# Patient Record
Sex: Female | Born: 1995 | Race: White | Hispanic: No | Marital: Single | Smoking: Never smoker
Health system: Southern US, Community
[De-identification: ages and names within clinical notes are randomized; demographics above are authoritative.]

## PROBLEM LIST (undated history)

## (undated) ENCOUNTER — Ambulatory Visit: Admission: EM | Payer: Medicaid Other | Source: Home / Self Care

## (undated) DIAGNOSIS — N730 Acute parametritis and pelvic cellulitis: Secondary | ICD-10-CM

## (undated) DIAGNOSIS — M1990 Unspecified osteoarthritis, unspecified site: Secondary | ICD-10-CM

## (undated) HISTORY — DX: Acute parametritis and pelvic cellulitis: N73.0

---
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
## 2015-05-21 DIAGNOSIS — Z6791 Unspecified blood type, Rh negative: Secondary | ICD-10-CM | POA: Insufficient documentation

## 2017-09-02 ENCOUNTER — Emergency Department: Payer: BC Managed Care – PPO

## 2017-09-02 ENCOUNTER — Ambulatory Visit
Admission: EM | Admit: 2017-09-02 | Discharge: 2017-09-02 | Disposition: A | Discharge status: Home / Self Care | Payer: BC Managed Care – PPO | Source: Home / Self Care | Attending: Emergency Medicine | Admitting: Emergency Medicine

## 2017-09-02 ENCOUNTER — Emergency Department
Admission: EM | Admit: 2017-09-02 | Discharge: 2017-09-02 | Disposition: A | Discharge status: Home / Self Care | Payer: BC Managed Care – PPO | Attending: Emergency Medicine | Admitting: Emergency Medicine

## 2017-09-02 ENCOUNTER — Other Ambulatory Visit: Payer: Self-pay

## 2017-09-02 ENCOUNTER — Encounter: Payer: Self-pay | Admitting: Emergency Medicine

## 2017-09-02 DIAGNOSIS — Z79899 Other long term (current) drug therapy: Secondary | ICD-10-CM | POA: Diagnosis not present

## 2017-09-02 DIAGNOSIS — N739 Female pelvic inflammatory disease, unspecified: Secondary | ICD-10-CM | POA: Diagnosis not present

## 2017-09-02 DIAGNOSIS — R102 Pelvic and perineal pain: Secondary | ICD-10-CM

## 2017-09-02 DIAGNOSIS — R1031 Right lower quadrant pain: Secondary | ICD-10-CM

## 2017-09-02 DIAGNOSIS — N730 Acute parametritis and pelvic cellulitis: Secondary | ICD-10-CM

## 2017-09-02 HISTORY — DX: Unspecified osteoarthritis, unspecified site: M19.90

## 2017-09-02 LAB — URINALYSIS, ROUTINE W REFLEX MICROSCOPIC
Bilirubin Urine: NEGATIVE
GLUCOSE, UA: NEGATIVE mg/dL
Hgb urine dipstick: NEGATIVE
Ketones, ur: NEGATIVE mg/dL
LEUKOCYTES UA: NEGATIVE
Nitrite: NEGATIVE
PROTEIN: NEGATIVE mg/dL
Specific Gravity, Urine: 1.009 (ref 1.005–1.030)
pH: 6 (ref 5.0–8.0)

## 2017-09-02 LAB — WET PREP, GENITAL
Sperm: NONE SEEN
Trich, Wet Prep: NONE SEEN

## 2017-09-02 LAB — CBC WITH DIFFERENTIAL/PLATELET
BASOS ABS: 0 10*3/uL (ref 0–0.1)
Basophils Relative: 0 %
EOS PCT: 1 %
Eosinophils Absolute: 0.1 10*3/uL (ref 0–0.7)
HEMATOCRIT: 38.3 % (ref 35.0–47.0)
Hemoglobin: 12.6 g/dL (ref 12.0–16.0)
LYMPHS PCT: 25 %
Lymphs Abs: 1.7 10*3/uL (ref 1.0–3.6)
MCH: 26.8 pg (ref 26.0–34.0)
MCHC: 33 g/dL (ref 32.0–36.0)
MCV: 81.2 fL (ref 80.0–100.0)
Monocytes Absolute: 0.5 10*3/uL (ref 0.2–0.9)
Monocytes Relative: 7 %
NEUTROS ABS: 4.6 10*3/uL (ref 1.4–6.5)
Neutrophils Relative %: 67 %
PLATELETS: 275 10*3/uL (ref 150–440)
RBC: 4.72 MIL/uL (ref 3.80–5.20)
RDW: 14.2 % (ref 11.5–14.5)
WBC: 6.9 10*3/uL (ref 3.6–11.0)

## 2017-09-02 LAB — URINALYSIS, COMPLETE (UACMP) WITH MICROSCOPIC
BACTERIA UA: NONE SEEN
Bilirubin Urine: NEGATIVE
Glucose, UA: NEGATIVE mg/dL
Hgb urine dipstick: NEGATIVE
KETONES UR: NEGATIVE mg/dL
Leukocytes, UA: NEGATIVE
Nitrite: NEGATIVE
PH: 7 (ref 5.0–8.0)
PROTEIN: NEGATIVE mg/dL
Specific Gravity, Urine: 1.015 (ref 1.005–1.030)
WBC, UA: NONE SEEN WBC/hpf (ref 0–5)

## 2017-09-02 LAB — CHLAMYDIA/NGC RT PCR (ARMC ONLY)
CHLAMYDIA TR: NOT DETECTED
N GONORRHOEAE: NOT DETECTED

## 2017-09-02 LAB — COMPREHENSIVE METABOLIC PANEL
ALT: 12 U/L — ABNORMAL LOW (ref 14–54)
ANION GAP: 4 — AB (ref 5–15)
AST: 16 U/L (ref 15–41)
Albumin: 4.1 g/dL (ref 3.5–5.0)
Alkaline Phosphatase: 52 U/L (ref 38–126)
BUN: 9 mg/dL (ref 6–20)
CO2: 28 mmol/L (ref 22–32)
Calcium: 8.8 mg/dL — ABNORMAL LOW (ref 8.9–10.3)
Chloride: 105 mmol/L (ref 101–111)
Creatinine, Ser: 0.48 mg/dL (ref 0.44–1.00)
Glucose, Bld: 95 mg/dL (ref 65–99)
POTASSIUM: 4.2 mmol/L (ref 3.5–5.1)
Sodium: 137 mmol/L (ref 135–145)
Total Bilirubin: 0.5 mg/dL (ref 0.3–1.2)
Total Protein: 7.3 g/dL (ref 6.5–8.1)

## 2017-09-02 LAB — PREGNANCY, URINE: Preg Test, Ur: NEGATIVE

## 2017-09-02 LAB — POCT PREGNANCY, URINE: Preg Test, Ur: NEGATIVE

## 2017-09-02 LAB — LIPASE, BLOOD: LIPASE: 24 U/L (ref 11–51)

## 2017-09-02 MED ORDER — AZITHROMYCIN 500 MG PO TABS
1000.0000 mg | ORAL_TABLET | Freq: Once | ORAL | Status: AC
  Administered 2017-09-02: 1000 mg via ORAL
  Filled 2017-09-02: qty 2

## 2017-09-02 MED ORDER — CEFTRIAXONE SODIUM 250 MG IJ SOLR
250.0000 mg | Freq: Once | INTRAMUSCULAR | Status: AC
  Administered 2017-09-02: 250 mg via INTRAMUSCULAR
  Filled 2017-09-02: qty 250

## 2017-09-02 MED ORDER — LIDOCAINE HCL (PF) 1 % IJ SOLN
5.0000 mL | Freq: Once | INTRAMUSCULAR | Status: AC
  Administered 2017-09-02: 1 mL

## 2017-09-02 MED ORDER — KETOROLAC TROMETHAMINE 30 MG/ML IJ SOLN
30.0000 mg | Freq: Once | INTRAMUSCULAR | Status: AC
  Administered 2017-09-02: 30 mg via INTRAMUSCULAR

## 2017-09-02 MED ORDER — DOXYCYCLINE HYCLATE 100 MG PO CAPS: 100.0000 mg | ORAL_CAPSULE | Freq: Two times a day (BID) | ORAL | 0 refills | Status: AC

## 2017-09-02 MED ORDER — LIDOCAINE HCL (PF) 1 % IJ SOLN
INTRAMUSCULAR | Status: AC
  Administered 2017-09-02: 1 mL
  Filled 2017-09-02: qty 5

## 2017-09-02 NOTE — Discharge Instructions (Signed)
Nothing to eat or drink until your evaluation is complete.  Let them know immediately if your abdominal pain changes, or gets worse.

## 2017-09-02 NOTE — ED Notes (Signed)
Pt states pelvic pain on R side. [facility]. No distress noted currently. Alert, oriented, ambulatory. No pain.

## 2017-09-02 NOTE — ED Triage Notes (Addendum)
Pt with right lower quad/pelvic pain off an on x past two years and hasn't had it checked out. Last two days it flared up again and is worse with standing up straight. Better with applied pressure. No n/v/d. Pt with no PCP

## 2017-09-02 NOTE — ED Triage Notes (Addendum)
Pt to ed from urgent care in [place].  Pt states went there today due to abd pain intermittent x 2 years worse over the last 2 days.  Pt states right lower pelvic pain, denies n/v/d. Pt states pressure applied to area makes pain better. LMP about 2 weeks ago.

## 2017-09-02 NOTE — ED Provider Notes (Signed)
[facility] Emergency Department Provider Note ___   First MD Initiated Contact with Patient 09/02/17 1706     (approximate)  I have reviewed the triage vital signs and the nursing notes.   HISTORY  Chief Complaint pelvic pain   HPI Brittany Horn is a 22 y.o. female presents to the emergency department with 7 out of 10 nonradiating right lower quadrant/right pelvic pain and dyspareunia intermittently times 3 years with acute worsening and persistence over the past 2 days.  Patient states that pain usually occurs following intercourse.  She states her current pain started 2 days ago following intercourse and again following intercourse again last night.  Patient denies any nausea vomiting diarrhea or constipation.  Patient denies any urinary symptoms.  Patient denies any vaginal discharge.  Patient denies any fever.  Patient denies any alleviating factors.  Regarding the patient's sexual history she states that she has been sexually active with the gentleman that is currently at the bedside for 3 years and only one other sexual partner that time which occurred 1 month ago which was unprotected.  Past Medical History:  Diagnosis Date  . Arthritis     There are no active problems to display for this patient.  Past surgical history None  Prior to Admission medications   Medication Sig Start Date End Date Taking? Authorizing Provider  doxycycline (VIBRAMYCIN) 100 MG capsule Take 1 capsule (100 mg total) by mouth 2 (two) times daily for 14 days. 09/02/17 09/16/17  Darci CurrentBrown, McArthur N, MD    Allergies No known drug allergies  Family History  Problem Relation Age of Onset  . Healthy Mother   . Healthy Father     Social History Social History   Tobacco Use  . Smoking status: Never Smoker  . Smokeless tobacco: Never Used  Substance Use Topics  . Alcohol use: Yes    Frequency: Never    Comment: social  . Drug use: No    Review of  Systems Constitutional: No fever/chills Eyes: No visual changes. ENT: No sore throat. Cardiovascular: Denies chest pain. Respiratory: Denies shortness of breath. Gastrointestinal: Positive for abdominal pain.  No nausea, no vomiting.  No diarrhea.  No constipation. Genitourinary: Negative for dysuria.  Positive for pelvic pain Musculoskeletal: Negative for neck pain.  Negative for back pain. Integumentary: Negative for rash. Neurological: Negative for headaches, focal weakness or numbness.  ____________________________________________   PHYSICAL EXAM:  VITAL SIGNS: ED Triage Vitals  Enc Vitals Group     BP 09/02/17 1328 123/70     Pulse Rate 09/02/17 1328 (!) 59     Resp 09/02/17 1328 18     Temp 09/02/17 1328 98.3 F (36.8 C)     Temp Source 09/02/17 1328 Oral     SpO2 09/02/17 1328 100 %     Weight 09/02/17 1329 83.9 kg (185 lb)     Height --      Head Circumference --      Peak Flow --      Pain Score 09/02/17 1329 0     Pain Loc --      Pain Edu? --      Excl. in GC? --     Constitutional: Alert and oriented. Well appearing and in no acute distress. Eyes: Conjunctivae are normal.  Head: Atraumatic. Mouth/Throat: Mucous membranes are moist.Oropharynx non-erythematous. Neck: No stridor.   Cardiovascular: Normal rate, regular rhythm. Good peripheral circulation. Grossly normal heart sounds. Respiratory: Normal respiratory effort.  No  retractions. Lungs CTAB. Gastrointestinal: Soft and nontender. No distention.  Genitourinary: Vaginal discharge noted.  Cervical motion tenderness.  Positive for yellowish white discharge Musculoskeletal: No lower extremity tenderness nor edema. No gross deformities of extremities. Neurologic:  Normal speech and language. No gross focal neurologic deficits are appreciated.  Skin:  Skin is warm, dry and intact. No rash noted. Psychiatric: Mood and affect are normal. Speech and behavior are  normal.  ____________________________________________   LABS (all labs ordered are listed, but only abnormal results are displayed)  Labs Reviewed  WET PREP, GENITAL - Abnormal; Notable for the following components:      Result Value   Yeast Wet Prep HPF POC PRESENT (*)    Clue Cells Wet Prep HPF POC PRESENT (*)    WBC, Wet Prep HPF POC MANY (*)    All other components within normal limits  COMPREHENSIVE METABOLIC PANEL - Abnormal; Notable for the following components:   Calcium 8.8 (*)    ALT 12 (*)    Anion gap 4 (*)    All other components within normal limits  URINALYSIS, ROUTINE W REFLEX MICROSCOPIC - Abnormal; Notable for the following components:   Color, Urine STRAW (*)    APPearance CLEAR (*)    All other components within normal limits  CHLAMYDIA/NGC RT PCR ([facility] ONLY)  LIPASE, BLOOD  CBC WITH DIFFERENTIAL/PLATELET  POC URINE PREG, ED  POCT PREGNANCY, URINE    RADIOLOGY I, Hissop N Tajai Suder, personally viewed and evaluated these images (plain radiographs) as part of my medical decision making, as well as reviewing the written report by the radiologist.   Official radiology report(s): US Pelvic Complete With Transvaginal  Result Date: 09/02/2017 CLINICAL DATA:  Pelvic pain x2 years EXAM: TRANSABDOMINAL AND TRANSVAGINAL ULTRASOUND OF PELVIS TECHNIQUE: Both transabdominal and transvaginal ultrasound examinations of the pelvis were performed. Transabdominal technique was performed for global imaging of the pelvis including uterus, ovaries, adnexal regions, and pelvic cul-de-sac. It was necessary to proceed with endovaginal exam following the transabdominal exam to visualize the uterus, endometrium and ovaries. COMPARISON:  None FINDINGS: Uterus Measurements: 9.2 x 4.6 x 5.6 cm. No fibroids or other mass visualized. Slight anteversion of the uterus. Endometrium Thickness: 13.3 mm.  No focal abnormality visualized. Right ovary Measurements: 3 x 3.1 x 3.3 cm.  Normal  appearance/no adnexal mass. Left ovary Measurements: 2.8 x 1.6 x 2.4 cm. Normal appearance/no adnexal mass. Other findings No abnormal free fluid.  The overlying bladder is unremarkable. IMPRESSION: Normal pelvic ultrasound. Electronically Signed   By: Tollie Eth M.D.   On: 09/02/2017 19:06    ____________________________________________    Procedures   ____________________________________________   INITIAL IMPRESSION / ASSESSMENT AND PLAN / ED COURSE  As part of my medical decision making, I reviewed the following data within the electronic MEDICAL RECORD 32 year old female presented with above-stated history and physical exam.  Concern for possible PID ovarian cyst versus less likely appendicitis.  Ultrasound revealed no evidence of ovarian cyst laboratory data unremarkable with the exception of cervical swab which revealed many white blood cells, clue cells & yeast.  Patient given ceftriaxone 2 and 50 mg IM as well as.  Patient will be prescribed doxycycline twice daily for 14 days.  Patient advised to follow-up with OB/GYN.     ____________________________________________  FINAL CLINICAL IMPRESSION(S) / ED DIAGNOSES  Final diagnoses:  Pelvic pain  PID (acute pelvic inflammatory disease)     MEDICATIONS GIVEN DURING THIS VISIT:  Medications  cefTRIAXone (ROCEPHIN) injection 250  mg (250 mg Intramuscular Given 09/02/17 2054)  azithromycin (ZITHROMAX) tablet 1,000 mg (1,000 mg Oral Given 09/02/17 2055)  lidocaine (PF) (XYLOCAINE) 1 % injection 5 mL (1 mL Other Given 09/02/17 2055)     ED Discharge Orders        Ordered    doxycycline (VIBRAMYCIN) 100 MG capsule  2 times daily     09/02/17 2043       Note:  This document was prepared using Dragon voice recognition software and may include unintentional dictation errors.    Darci Current, MD 09/06/17 [phone]

## 2017-09-02 NOTE — ED Notes (Signed)
Lidocaine mixed with rocephin.

## 2017-09-02 NOTE — ED Provider Notes (Signed)
HPI  SUBJECTIVE:  Brittany Horn is a 22 y.o. female who presents with sharp, nonmigratory, nonradiating right lower quadrant pain for the past 2 days.  She has had pain in this area for about 2 years but it was intermittent.  She states that it became constant 2 days ago and acutely worse last night after having intercourse.  States that intercourse has been painful for the past 2 years but was extremely painful last night.  She states it was a gradual onset pain.  She tried applying pressure with improvement in her symptoms.  Symptoms are worse with lying flat, standing up, walking.  Is not associated eating, drinking, urination, defecation.  She denies nausea, vomiting, fevers, anorexia, urinary complaints.  No vaginal bleeding, odor, discharge, rash.  No abdominal distention, back pain.  States that the car ride over here was not particularly painful.  Is in a long-term monogamous relationship with her husband, who is asymptomatic.  STDs are not a concern today.  She has a past medical history of rheumatoid arthritis and a right-sided ovarian cyst.  No history of gonorrhea, chlamydia, HIV, HSV, syphilis, Trichomonas, BV, yeast, PID, ectopic pregnancy, endometriosis, PCOS, tubo-ovarian abscess.  LMP: 2/3.  PMD: None.  Last meal at 10:45 AM.    Past Medical History:  Diagnosis Date  . Arthritis     History reviewed. No pertinent surgical history.  Family History  Problem Relation Age of Onset  . Healthy Mother   . Healthy Father     Social History   Tobacco Use  . Smoking status: Never Smoker  . Smokeless tobacco: Never Used  Substance Use Topics  . Alcohol use: Yes    Frequency: Never    Comment: social  . Drug use: No     Current Facility-Administered Medications:  .  ketorolac (TORADOL) 30 MG/ML injection 30 mg, 30 mg, Intramuscular, Once, Domenick GongMortenson, Cailin Gebel, MD No current outpatient medications on file.  No Known Allergies   ROS  As noted in HPI.   Physical  Exam  BP 109/60 (BP Location: Left Arm)   Pulse 73   Temp 97.6 F (36.4 C) (Oral)   Resp 16   Ht 5\' 8"  (1.727 m)   Wt 185 lb (83.9 kg)   LMP 08/20/2017   SpO2 98%   BMI 28.13 kg/m   Constitutional: Well developed, well nourished, appears uncomfortable Eyes:  EOMI, conjunctiva normal bilaterally HENT: Normocephalic, atraumatic,mucus membranes moist Respiratory: Normal inspiratory effort Cardiovascular: Normal rate GI: nondistended flat, soft.  Positive right lower quadrant tenderness with no guarding, rebound.  Positive Rovsing.  Active bowel sounds.  Negative tap table test. Back: No CVAT skin: No rash, skin intact Musculoskeletal: no deformities Neurologic: Alert & oriented x 3, no focal neuro deficits Psychiatric: Speech and behavior appropriate   ED Course   Medications  ketorolac (TORADOL) 30 MG/ML injection 30 mg (not administered)    Orders Placed This Encounter  Procedures  . Urinalysis, Complete w Microscopic    Standing Status:   Standing    Number of Occurrences:   1  . Pregnancy, urine    Standing Status:   Standing    Number of Occurrences:   1    Results for orders placed or performed during the hospital encounter of 09/02/17 (from the past 24 hour(s))  Urinalysis, Complete w Microscopic     Status: Abnormal   Collection Time: 09/02/17 11:35 AM  Result Value Ref Range   Color, Urine YELLOW YELLOW   APPearance  CLEAR CLEAR   Specific Gravity, Urine 1.015 1.005 - 1.030   pH 7.0 5.0 - 8.0   Glucose, UA NEGATIVE NEGATIVE mg/dL   Hgb urine dipstick NEGATIVE NEGATIVE   Bilirubin Urine NEGATIVE NEGATIVE   Ketones, ur NEGATIVE NEGATIVE mg/dL   Protein, ur NEGATIVE NEGATIVE mg/dL   Nitrite NEGATIVE NEGATIVE   Leukocytes, UA NEGATIVE NEGATIVE   Squamous Epithelial / LPF 6-30 (A) NONE SEEN   WBC, UA NONE SEEN 0 - 5 WBC/hpf   RBC / HPF 0-5 0 - 5 RBC/hpf   Bacteria, UA NONE SEEN NONE SEEN  Pregnancy, urine     Status: None   Collection Time: 09/02/17  11:36 AM  Result Value Ref Range   Preg Test, Ur NEGATIVE NEGATIVE   No results found.  ED Clinical Impression  Right lower quadrant abdominal pain   ED Assessment/Plan  She is not pregnant.  Concern for appendicitis although mesenteric adenitis vs a gynecologic cause such as ruptured ovarian cysts, PID, TOA are in the differential.  Doubt ovarian torsion.  Giving shot of Toradol 30 mg IM, transferring to [facility] ED for a comprehensive workup.  Advised patient to be n.p.o. until evaluation is complete.  Feel that the patient is stable to go by private vehicle.  Notified Gearldine Bienenstock, Consulting civil engineer.   Discussed rationale for transfer to the emergency department.  Patient and husband agree with plan.  Meds ordered this encounter  Medications  . ketorolac (TORADOL) 30 MG/ML injection 30 mg    *This clinic note was created using Scientist, clinical (histocompatibility and immunogenetics). Therefore, there may be occasional mistakes despite careful proofreading.   ?   Domenick Gong, MD 09/02/17 1241

## 2017-09-19 ENCOUNTER — Ambulatory Visit (INDEPENDENT_AMBULATORY_CARE_PROVIDER_SITE_OTHER): Payer: BC Managed Care – PPO | Admitting: Obstetrics & Gynecology

## 2017-09-19 ENCOUNTER — Encounter: Payer: Self-pay | Admitting: Obstetrics & Gynecology

## 2017-09-19 VITALS — BP 100/60 | HR 63 | Ht 68.0 in | Wt 186.0 lb

## 2017-09-19 DIAGNOSIS — N9419 Other specified dyspareunia: Secondary | ICD-10-CM | POA: Diagnosis not present

## 2017-09-19 DIAGNOSIS — R102 Pelvic and perineal pain: Secondary | ICD-10-CM | POA: Insufficient documentation

## 2017-09-19 NOTE — Patient Instructions (Signed)
Ethinyl Estradiol; Norethindrone Acetate; Ferrous fumarate tablets or capsules What is this medicine? ETHINYL ESTRADIOL; NORETHINDRONE ACETATE; FERROUS FUMARATE (ETH in il es tra DYE ole; nor eth IN drone AS e tate; FER us FUE ma rate) is an oral contraceptive. The products combine two types of female hormones, an estrogen and a progestin. They are used to prevent ovulation and pregnancy. Some products are also used to treat acne in females. This medicine may be used for other purposes; ask your health care provider or pharmacist if you have questions. COMMON BRAND NAME(S): Blisovi 24 Fe, Blisovi Fe, Estrostep Fe, Gildess 24 Fe, Gildess Fe 1.5/30, Gildess Fe 1/20, Junel Fe 1.5/30, Junel Fe 1/20, Junel Fe 24, Larin Fe, Lo Loestrin Fe, Loestrin 24 Fe, Loestrin FE 1.5/30, Loestrin FE 1/20, Lomedia 24 Fe, Microgestin 24 Fe, Microgestin Fe 1.5/30, Microgestin Fe 1/20, Tarina Fe 1/20, Taytulla, Tilia Fe, Tri-Legest Fe What should I tell my health care provider before I take this medicine? They need to know if you have any of these conditions: -abnormal vaginal bleeding -blood vessel disease -breast, cervical, endometrial, ovarian, liver, or uterine cancer -diabetes -gallbladder disease -heart disease or recent heart attack -high blood pressure -high cholesterol -history of blood clots -kidney disease -liver disease -migraine headaches -smoke tobacco -stroke -systemic lupus erythematosus (SLE) -an unusual or allergic reaction to estrogens, progestins, other medicines, foods, dyes, or preservatives -pregnant or trying to get pregnant -breast-feeding How should I use this medicine? Take this medicine by mouth. To reduce nausea, this medicine may be taken with food. Follow the directions on the prescription label. Take this medicine at the same time each day and in the order directed on the package. Do not take your medicine more often than directed. A patient package insert for the product will be  given with each prescription and refill. Read this sheet carefully each time. The sheet may change frequently. Contact your pediatrician regarding the use of this medicine in children. Special care may be needed. This medicine has been used in female children who have started having menstrual periods. Overdosage: If you think you have taken too much of this medicine contact a poison control center or emergency room at once. NOTE: This medicine is only for you. Do not share this medicine with others. What if I miss a dose? If you miss a dose, refer to the patient information sheet you received with your medicine for direction. If you miss more than one pill, this medicine may not be as effective and you may need to use another form of birth control. What may interact with this medicine? Do not take this medicine with the following medication: -dasabuvir; ombitasvir; paritaprevir; ritonavir -ombitasvir; paritaprevir; ritonavir This medicine may also interact with the following medications: -acetaminophen -antibiotics or medicines for infections, especially rifampin, rifabutin, rifapentine, and griseofulvin, and possibly penicillins or tetracyclines -aprepitant -ascorbic acid (vitamin C) -atorvastatin -barbiturate medicines, such as phenobarbital -bosentan -carbamazepine -caffeine -clofibrate -cyclosporine -dantrolene -doxercalciferol -felbamate -grapefruit juice -hydrocortisone -medicines for anxiety or sleeping problems, such as diazepam or temazepam -medicines for diabetes, including pioglitazone -mineral oil -modafinil -mycophenolate -nefazodone -oxcarbazepine -phenytoin -prednisolone -ritonavir or other medicines for HIV infection or AIDS -rosuvastatin -selegiline -soy isoflavones supplements -St. John's wort -tamoxifen or raloxifene -theophylline -thyroid hormones -topiramate -warfarin This list may not describe all possible interactions. Give your health care  provider a list of all the medicines, herbs, non-prescription drugs, or dietary supplements you use. Also tell them if you smoke, drink alcohol, or use illegal drugs. Some   items may interact with your medicine. What should I watch for while using this medicine? Visit your doctor or health care professional for regular checks on your progress. You will need a regular breast and pelvic exam and Pap smear while on this medicine. Use an additional method of contraception during the first cycle that you take these tablets. If you have any reason to think you are pregnant, stop taking this medicine right away and contact your doctor or health care professional. If you are taking this medicine for hormone related problems, it may take several cycles of use to see improvement in your condition. Smoking increases the risk of getting a blood clot or having a stroke while you are taking birth control pills, especially if you are more than 24 years old. You are strongly advised not to smoke. This medicine can make your body retain fluid, making your fingers, hands, or ankles swell. Your blood pressure can go up. Contact your doctor or health care professional if you feel you are retaining fluid. This medicine can make you more sensitive to the sun. Keep out of the sun. If you cannot avoid being in the sun, wear protective clothing and use sunscreen. Do not use sun lamps or tanning beds/booths. If you wear contact lenses and notice visual changes, or if the lenses begin to feel uncomfortable, consult your eye care specialist. In some women, tenderness, swelling, or minor bleeding of the gums may occur. Notify your dentist if this happens. Brushing and flossing your teeth regularly may help limit this. See your dentist regularly and inform your dentist of the medicines you are taking. If you are going to have elective surgery, you may need to stop taking this medicine before the surgery. Consult your health care  professional for advice. This medicine does not protect you against HIV infection (AIDS) or any other sexually transmitted diseases. What side effects may I notice from receiving this medicine? Side effects that you should report to your doctor or health care professional as soon as possible: -allergic reactions like skin rash, itching or hives, swelling of the face, lips, or tongue -breast tissue changes or discharge -changes in vaginal bleeding during your period or between your periods -changes in vision -chest pain -confusion -coughing up blood -dizziness -feeling faint or lightheaded -headaches or migraines -leg, arm or groin pain -loss of balance or coordination -severe or sudden headaches -stomach pain (severe) -sudden shortness of breath -sudden numbness or weakness of the face, arm or leg -symptoms of vaginal infection like itching, irritation or unusual discharge -tenderness in the upper abdomen -trouble speaking or understanding -vomiting -yellowing of the eyes or skin Side effects that usually do not require medical attention (report to your doctor or health care professional if they continue or are bothersome): -breakthrough bleeding and spotting that continues beyond the 3 initial cycles of pills -breast tenderness -mood changes, anxiety, depression, frustration, anger, or emotional outbursts -increased sensitivity to sun or ultraviolet light -nausea -skin rash, acne, or brown spots on the skin -weight gain (slight) This list may not describe all possible side effects. Call your doctor for medical advice about side effects. You may report side effects to FDA at 1-800-FDA-1088. Where should I keep my medicine? Keep out of the reach of children. Store at room temperature between 15 and 30 degrees C (59 and 86 degrees F). Throw away any unused medicine after the expiration date. NOTE: This sheet is a summary. It may not cover all possible information. If you   have  questions about this medicine, talk to your doctor, pharmacist, or health care provider.  2018 Elsevier/Gold Standard (2016-03-14 08:04:41)  

## 2017-09-19 NOTE — Progress Notes (Signed)
   Gynecology Pelvic Pain Evaluation   Chief Complaint:  Chief Complaint  Patient presents with  . Pelvic Inflammatory Disease    History of Present Illness:   Patient is a 22 y.o. [id number] who LMP was Patient's last menstrual period was 09/19/2017., presents today for a problem visit.  She complains of pain.   Her pain is localized to the deep pelvis area, described as intermittent, began 3 years and its severity is described as moderate. The pain radiates to the  Non-radiating. She has these associated symptoms which include none. Patient has these modifiers which include relaxation and pain medication that make it better and sex that make it worse.  Context includes: during/after intercourse.  Was seen in ER recently for worsening pain and was treated for PID, she had neg cultures and US at that time.  No prior laparoecopy.  Has prior NSVD 2 years ago.  PMHx: She  has a past medical history of Arthritis and PID (acute pelvic inflammatory disease). Also,  has no past surgical history on file., family history includes Healthy in her father and mother.,  reports that  has never smoked. she has never used smokeless tobacco. She reports that she drinks alcohol. She reports that she does not use drugs.  She has a current medication list which includes the following prescription(s): doxycycline. Also, has No Known Allergies.  Review of Systems  Constitutional: Negative for chills, fever and malaise/fatigue.  HENT: Negative for congestion, sinus pain and sore throat.   Eyes: Negative for blurred vision and pain.  Respiratory: Negative for cough and wheezing.   Cardiovascular: Negative for chest pain and leg swelling.  Gastrointestinal: Negative for abdominal pain, constipation, diarrhea, heartburn, nausea and vomiting.  Genitourinary: Negative for dysuria, frequency, hematuria and urgency.  Musculoskeletal: Negative for back pain, joint pain, myalgias and neck pain.  Skin: Negative for itching  and rash.  Neurological: Negative for dizziness, tremors and weakness.  Endo/Heme/Allergies: Does not bruise/bleed easily.  Psychiatric/Behavioral: Negative for depression. The patient is not nervous/anxious and does not have insomnia.     Objective: BP 100/60   Pulse 63   Ht 5\' 8"  (1.727 m)   Wt 186 lb (84.4 kg)   LMP 09/19/2017   BMI 28.28 kg/m  Physical Exam  Constitutional: She is oriented to person, place, and time. She appears well-developed and well-nourished. No distress.  Cardiovascular: Regular rhythm, normal heart sounds and normal pulses. Exam reveals no gallop and no friction rub.  No murmur heard. Pulmonary/Chest: Effort normal and breath sounds normal. She exhibits no tenderness.  Abdominal: Normal appearance and bowel sounds are normal. There is no tenderness. No hernia.  Musculoskeletal: Normal range of motion.  Neurological: She is alert and oriented to person, place, and time.  Skin: Skin is warm and dry.  Psychiatric: She has a normal mood and affect.  Vitals reviewed.  Female chaperone present for pelvic portion of the physical exam  Assessment: 22 y.o. [id number] with Dyspareunia; cyclical pelvic pain w periods.  1. Pelvic pain and dyspareunia May be form endometriosis, adhesions, PID Recent tx w doxycycline has helped to date, need to see long term Desires OCP anyways so trial within reason Lo LOEstrin F/u 2 mos PAP needed then as well  Annamarie MajorPaul Kamdyn Covel, MD, Merlinda FrederickFACOG [facility], [facility] 09/19/2017  9:16 AM

## 2017-09-29 ENCOUNTER — Ambulatory Visit: Payer: Self-pay | Admitting: Unknown Physician Specialty

## 2017-10-24 ENCOUNTER — Ambulatory Visit: Payer: Self-pay | Admitting: Unknown Physician Specialty

## 2017-11-21 ENCOUNTER — Encounter: Payer: BC Managed Care – PPO | Admitting: Obstetrics & Gynecology

## 2017-11-21 ENCOUNTER — Encounter

## 2018-01-14 ENCOUNTER — Emergency Department
Admission: EM | Admit: 2018-01-14 | Discharge: 2018-01-14 | Disposition: A | Discharge status: Home / Self Care | Payer: BC Managed Care – PPO | Attending: Emergency Medicine | Admitting: Emergency Medicine

## 2018-01-14 ENCOUNTER — Encounter: Payer: Self-pay | Admitting: Emergency Medicine

## 2018-01-14 ENCOUNTER — Other Ambulatory Visit: Payer: Self-pay

## 2018-01-14 DIAGNOSIS — O210 Mild hyperemesis gravidarum: Secondary | ICD-10-CM | POA: Insufficient documentation

## 2018-01-14 DIAGNOSIS — Z3A13 13 weeks gestation of pregnancy: Secondary | ICD-10-CM | POA: Insufficient documentation

## 2018-01-14 DIAGNOSIS — O219 Vomiting of pregnancy, unspecified: Secondary | ICD-10-CM | POA: Diagnosis present

## 2018-01-14 LAB — LIPASE, BLOOD: Lipase: 28 U/L (ref 11–51)

## 2018-01-14 LAB — URINALYSIS, COMPLETE (UACMP) WITH MICROSCOPIC
Bacteria, UA: NONE SEEN
Bilirubin Urine: NEGATIVE
Glucose, UA: NEGATIVE mg/dL
Hgb urine dipstick: NEGATIVE
KETONES UR: 20 mg/dL — AB
LEUKOCYTES UA: NEGATIVE
Nitrite: NEGATIVE
PROTEIN: NEGATIVE mg/dL
Specific Gravity, Urine: 1.009 (ref 1.005–1.030)
pH: 7 (ref 5.0–8.0)

## 2018-01-14 LAB — COMPREHENSIVE METABOLIC PANEL
ALT: 13 U/L (ref 0–44)
AST: 20 U/L (ref 15–41)
Albumin: 3.8 g/dL (ref 3.5–5.0)
Alkaline Phosphatase: 49 U/L (ref 38–126)
Anion gap: 7 (ref 5–15)
BUN: <5 mg/dL — ABNORMAL LOW (ref 6–20)
CALCIUM: 8.8 mg/dL — AB (ref 8.9–10.3)
CHLORIDE: 105 mmol/L (ref 98–111)
CO2: 23 mmol/L (ref 22–32)
CREATININE: 0.34 mg/dL — AB (ref 0.44–1.00)
Glucose, Bld: 84 mg/dL (ref 70–99)
Potassium: 4 mmol/L (ref 3.5–5.1)
SODIUM: 135 mmol/L (ref 135–145)
Total Bilirubin: 0.7 mg/dL (ref 0.3–1.2)
Total Protein: 7.4 g/dL (ref 6.5–8.1)

## 2018-01-14 LAB — CBC
HCT: 36.9 % (ref 35.0–47.0)
Hemoglobin: 12.9 g/dL (ref 12.0–16.0)
MCH: 28.6 pg (ref 26.0–34.0)
MCHC: 34.9 g/dL (ref 32.0–36.0)
MCV: 82 fL (ref 80.0–100.0)
PLATELETS: 210 10*3/uL (ref 150–440)
RBC: 4.5 MIL/uL (ref 3.80–5.20)
RDW: 14.4 % (ref 11.5–14.5)
WBC: 8.7 10*3/uL (ref 3.6–11.0)

## 2018-01-14 MED ORDER — METOCLOPRAMIDE HCL 10 MG PO TABS: 10.0000 mg | ORAL_TABLET | Freq: Four times a day (QID) | ORAL | 0 refills | Status: DC | PRN

## 2018-01-14 MED ORDER — SODIUM CHLORIDE 0.9 % IV BOLUS
1000.0000 mL | Freq: Once | INTRAVENOUS | Status: AC
  Administered 2018-01-14: 1000 mL via INTRAVENOUS

## 2018-01-14 NOTE — ED Notes (Signed)
Pt given warm blanket  Family at bedside

## 2018-01-14 NOTE — ED Notes (Signed)
ED Provider at bedside. 

## 2018-01-14 NOTE — ED Triage Notes (Addendum)
Pt in via POV with complaints of constant emesis and possible dehydration.  Pt denies being able to keep anything down x 2 days, reports unable to urinate since last night.  Pt is [redacted] weeks pregnant.  Vitals WDL, NAD noted at this time.

## 2018-01-14 NOTE — ED Provider Notes (Signed)
[facility] Emergency Department Provider Note  Time seen: 5:11 PM  I have reviewed the triage vital signs and the nursing notes.   HISTORY  Chief Complaint Emesis and Dehydration    HPI Brittany Horn is a 22 y.o. female with a past medical history of arthritis, G2, P1 at [redacted] weeks pregnant presents to the emergency department for nausea vomiting.  According to the patient throughout this pregnancy she has been experiencing frequent episodes of nausea vomiting.  Has tried taking vitamin B6, with little relief.  Patient states she has been vomiting for 3 days straight has not urinated since last night.  Denies any abdominal pain, denies vaginal bleeding or discharge.  Denies dysuria.   Past Medical History:  Diagnosis Date  . Arthritis   . PID (acute pelvic inflammatory disease)     Patient Active Problem List   Diagnosis Date Noted  . Dyspareunia due to medical condition in female 09/19/2017    History reviewed. No pertinent surgical history.  Prior to Admission medications   Medication Sig Start Date End Date Taking? Authorizing Provider  doxycycline (VIBRAMYCIN) 100 MG capsule Take 100 mg by mouth 2 (two) times daily.    [provider]    No Known Allergies  Family History  Problem Relation Age of Onset  . Healthy Mother   . Healthy Father     Social History Social History   Tobacco Use  . Smoking status: Never Smoker  . Smokeless tobacco: Never Used  Substance Use Topics  . Alcohol use: Yes    Frequency: Never    Comment: social  . Drug use: No    Review of Systems Constitutional: Negative for fever. Eyes: Negative for visual complaints ENT: Negative for recent illness/congestion Cardiovascular: Negative for chest pain. Respiratory: Negative for shortness of breath. Gastrointestinal: Negative for abdominal pain.  Positive for frequent nausea vomiting last episode occurred approximately 2 hours ago. Genitourinary:  Negative for urinary compaints Musculoskeletal: Negative for musculoskeletal complaints Skin: Negative for skin complaints  Neurological: Negative for headache All other ROS negative  ____________________________________________   PHYSICAL EXAM:  VITAL SIGNS: ED Triage Vitals  Enc Vitals Group     BP 01/14/18 1631 125/72     Pulse Rate 01/14/18 1631 78     Resp 01/14/18 1631 16     Temp 01/14/18 1631 98.1 F (36.7 C)     Temp Source 01/14/18 1631 Oral     SpO2 01/14/18 1631 99 %     Weight 01/14/18 1632 170 lb (77.1 kg)     Height 01/14/18 1632 5\' 8"  (1.727 m)     Head Circumference --      Peak Flow --      Pain Score 01/14/18 1632 5     Pain Loc --      Pain Edu? --      Excl. in GC? --    Constitutional: Alert and oriented. Well appearing and in no distress. Eyes: Normal exam ENT   Head: Normocephalic and atraumatic.   Mouth/Throat: Mucous membranes are moist. Cardiovascular: Normal rate, regular rhythm. No murmur Respiratory: Normal respiratory effort without tachypnea nor retractions. Breath sounds are clear  Gastrointestinal: Soft and nontender. No distention.  Musculoskeletal: Nontender with normal range of motion in all extremities.  Neurologic:  Normal speech and language. No gross focal neurologic deficits  Skin:  Skin is warm, dry and intact.  Psychiatric: Mood and affect are normal.   ____________________________________________   INITIAL IMPRESSION /  ASSESSMENT AND PLAN / ED COURSE  Pertinent labs & imaging results that were available during my care of the patient were reviewed by me and considered in my medical decision making (see chart for details).  Patient presents to the emergency department for nausea vomiting during pregnancy.  Differential would include hyperemesis gravidarum, gastroenteritis, gastritis.  We will check labs, IV hydrate and continue to closely monitor.  I discussed with the patient at length different nausea medication  such as Zofran and Reglan and Phenergan.  She would like to discuss this further with her husband before making a decision.  Discussed the possible cons as well as process of using Zofran, including the study showing an increased risk of cleft lip/palate.  Bedside ultrasound performed by myself shows good fetal movement with a normal heart rate around 152 on Doppler.  Labs are largely nonrevealing.  Anion gap is normal.  Urinalysis is normal besides a mild amount of ketones.  Patient has received 2 L of fluids, she is feeling better.  We will discharge the patient home with Reglan and have the patient follow-up with her OB/GYN. ____________________________________________   FINAL CLINICAL IMPRESSION(S) / ED DIAGNOSES  Hyperemesis gravidarum    Minna AntisPaduchowski, Dyrell Tuccillo, MD 01/14/18 1931

## 2018-01-14 NOTE — ED Notes (Signed)
Pt unable to urinate at this time.  Specimen cup provided to pt, asked to let RN know when she feels she can provide a sample.

## 2018-09-13 ENCOUNTER — Encounter: Payer: Self-pay | Admitting: Emergency Medicine

## 2018-09-13 ENCOUNTER — Ambulatory Visit
Admission: EM | Admit: 2018-09-13 | Discharge: 2018-09-13 | Disposition: A | Discharge status: Home / Self Care | Payer: BC Managed Care – PPO | Attending: Family Medicine | Admitting: Family Medicine

## 2018-09-13 DIAGNOSIS — E0789 Other specified disorders of thyroid: Secondary | ICD-10-CM | POA: Diagnosis not present

## 2018-09-13 LAB — CBC WITH DIFFERENTIAL/PLATELET
Abs Immature Granulocytes: 0.03 10*3/uL (ref 0.00–0.07)
Basophils Absolute: 0 10*3/uL (ref 0.0–0.1)
Basophils Relative: 0 %
Eosinophils Absolute: 0.1 10*3/uL (ref 0.0–0.5)
Eosinophils Relative: 2 %
HCT: 40.9 % (ref 36.0–46.0)
Hemoglobin: 13.3 g/dL (ref 12.0–15.0)
Immature Granulocytes: 0 %
Lymphocytes Relative: 26 %
Lymphs Abs: 1.8 10*3/uL (ref 0.7–4.0)
MCH: 27.5 pg (ref 26.0–34.0)
MCHC: 32.5 g/dL (ref 30.0–36.0)
MCV: 84.7 fL (ref 80.0–100.0)
Monocytes Absolute: 0.3 10*3/uL (ref 0.1–1.0)
Monocytes Relative: 5 %
Neutro Abs: 4.8 10*3/uL (ref 1.7–7.7)
Neutrophils Relative %: 67 %
Platelets: 279 10*3/uL (ref 150–400)
RBC: 4.83 MIL/uL (ref 3.87–5.11)
RDW: 13.1 % (ref 11.5–15.5)
WBC: 7.1 10*3/uL (ref 4.0–10.5)
nRBC: 0 % (ref 0.0–0.2)

## 2018-09-13 LAB — RAPID STREP SCREEN (MED CTR MEBANE ONLY): Streptococcus, Group A Screen (Direct): NEGATIVE

## 2018-09-13 NOTE — Discharge Instructions (Signed)
Use ibuprofen 600 mg 3 times daily with food as necessary for throat pain.  Schedule follow-up with ear nose and throat specialist as soon as possible.

## 2018-09-13 NOTE — ED Triage Notes (Signed)
Patient c/o sore throat that started 4 days ago. She states she had some left over antibiotics that she has taken for 3 days. Denies fever.

## 2018-09-13 NOTE — ED Provider Notes (Signed)
[facility]    CSN: [id number] Arrival date & time: 09/13/18  [id number]     History   Chief Complaint Chief Complaint  Patient presents with  . Sore Throat    HPI Brittany Horn is a 23 y.o. female.   HPI  Female presents with sore throat that started about 4 days ago.  He has been self treating with Augmentin that she has had leftover been taking it for last 3 days.  She said no fever or chills.  She feels as though something is stuck in her throat.  She has tenderness over the thyroid.  She was up all night last night trying to clear her throat.  She used cold water hot water steam to no avail.  Has had no breathing difficulties.  Last dose of the Augmentin was yesterday morning.  Her children has been have been sick recently with colds.  Vital signs today are normal.  Currently breast-feeding       Past Medical History:  Diagnosis Date  . Arthritis   . PID (acute pelvic inflammatory disease)     Patient Active Problem List   Diagnosis Date Noted  . Dyspareunia due to medical condition in female 09/19/2017    History reviewed. No pertinent surgical history.  OB History    Gravida  3   Para  2   Term  2   Preterm      AB      Living  2     SAB      TAB      Ectopic      Multiple  0   Live Births  1            Home Medications    Prior to Admission medications   Not on File    Family History Family History  Problem Relation Age of Onset  . Healthy Mother   . Healthy Father     Social History Social History   Tobacco Use  . Smoking status: Never Smoker  . Smokeless tobacco: Never Used  Substance Use Topics  . Alcohol use: Yes    Frequency: Never    Comment: social  . Drug use: No     Allergies   Patient has no known allergies.   Review of Systems Review of Systems  Constitutional: Positive for activity change. Negative for appetite change, chills, fatigue and fever.  HENT: Positive for trouble swallowing.     All other systems reviewed and are negative.    Physical Exam Triage Vital Signs ED Triage Vitals  Enc Vitals Group     BP 09/13/18 0853 108/73     Pulse Rate 09/13/18 0853 73     Resp 09/13/18 0853 18     Temp 09/13/18 0853 98.1 F (36.7 C)     Temp Source 09/13/18 0853 Oral     SpO2 09/13/18 0853 100 %     Weight 09/13/18 0851 180 lb (81.6 kg)     Height 09/13/18 0851 5\' 8"  (1.727 m)     Head Circumference --      Peak Flow --      Pain Score 09/13/18 0851 5     Pain Loc --      Pain Edu? --      Excl. in GC? --    No data found.  Updated Vital Signs BP 108/73 (BP Location: Left Arm)   Pulse 73   Temp 98.1 F (36.7 C) (  Oral)   Resp 18   Ht 5\' 8"  (1.727 m)   Wt 180 lb (81.6 kg)   LMP 09/19/2017 (LMP Unknown)   SpO2 100%   Breastfeeding Yes   BMI 27.37 kg/m   Visual Acuity Right Eye Distance:   Left Eye Distance:   Bilateral Distance:    Right Eye Near:   Left Eye Near:    Bilateral Near:     Physical Exam Vitals signs and nursing note reviewed.  Constitutional:      General: She is not in acute distress.    Appearance: She is well-developed. She is not ill-appearing, toxic-appearing or diaphoretic.  HENT:     Head: Normocephalic.     Right Ear: Tympanic membrane and ear canal normal. No drainage.     Left Ear: Tympanic membrane and ear canal normal. No drainage.     Nose: No congestion or rhinorrhea.     Mouth/Throat:     Mouth: Mucous membranes are moist. No oral lesions.     Pharynx: Oropharynx is clear. Uvula midline. No pharyngeal swelling, oropharyngeal exudate, posterior oropharyngeal erythema or uvula swelling.     Tonsils: No tonsillar exudate or tonsillar abscesses. Swelling: 0 on the right. 0 on the left.     Comments: Patient has pain with palpation of the thyroid mostly right lobe appears tender.  There is no bogginess no nodules are appreciated.  It does appear to be mildly enlarged.  Seems to be the locus of her complaints and  symptoms. Eyes:     Conjunctiva/sclera: Conjunctivae normal.     Pupils: Pupils are equal, round, and reactive to light.  Neck:     Musculoskeletal: Normal range of motion and neck supple.     Thyroid: Thyromegaly present.     Comments: Refer to oropharynx exam Pulmonary:     Effort: Pulmonary effort is normal.     Breath sounds: Normal breath sounds.  Lymphadenopathy:     Cervical: No cervical adenopathy.  Skin:    General: Skin is warm and dry.  Neurological:     General: No focal deficit present.     Mental Status: She is alert and oriented to person, place, and time.  Psychiatric:        Mood and Affect: Mood normal.        Behavior: Behavior normal.      UC Treatments / Results  Labs (all labs ordered are listed, but only abnormal results are displayed) Labs Reviewed  RAPID STREP SCREEN ([facility] ONLY)  CULTURE, GROUP A STREP ([facility])  CBC WITH DIFFERENTIAL/PLATELET  TSH    EKG None  Radiology No results found.  Procedures Procedures (including critical care time)  Medications Ordered in UC Medications - No data to display  Initial Impression / Assessment and Plan / UC Course  I have reviewed the triage vital signs and the nursing notes.  Pertinent labs & imaging results that were available during my care of the patient were reviewed by me and considered in my medical decision making (see chart for details).   Her exam of her throat today was negative for tenderness of her the thyroid which appears to be slightly enlarged.  No nodularities were appreciated.  Recommended salt water gargles for her throat pain and also ibuprofen 600 mg 3 times daily with food.  I have given her the name of ear nose and throat specialist for further evaluation.  She should contact them today for an appointment as soon as  possible.  TSH was drawn today but results not available until later today or tomorrow.  Also recommended she follow-up with her primary care  physician.   Final Clinical Impressions(s) / UC Diagnoses   Final diagnoses:  Thyroid pain     Discharge Instructions     Use ibuprofen 600 mg 3 times daily with food as necessary for throat pain.  Schedule follow-up with ear nose and throat specialist as soon as possible.    ED Prescriptions    None     Controlled Substance Prescriptions [place] Controlled Substance Registry consulted? Not Applicable   Lutricia Feil, PA-C 09/13/18 1011

## 2018-09-14 LAB — TSH: TSH: 1.844 u[IU]/mL (ref 0.350–4.500)

## 2018-09-15 LAB — CULTURE, GROUP A STREP (THRC)

## 2018-09-17 ENCOUNTER — Telehealth (HOSPITAL_COMMUNITY): Payer: Self-pay | Admitting: Emergency Medicine

## 2018-09-17 NOTE — Telephone Encounter (Signed)
Patient contacted and made aware of all results, all questions answered.   

## 2018-09-24 ENCOUNTER — Ambulatory Visit: Payer: Self-pay | Admitting: Family Medicine

## 2019-08-02 ENCOUNTER — Other Ambulatory Visit: Payer: Self-pay

## 2019-08-02 ENCOUNTER — Encounter: Payer: Self-pay | Admitting: Emergency Medicine

## 2019-08-02 ENCOUNTER — Ambulatory Visit
Admission: EM | Admit: 2019-08-02 | Discharge: 2019-08-02 | Disposition: A | Discharge status: Home / Self Care | Payer: BC Managed Care – PPO | Attending: Family Medicine | Admitting: Family Medicine

## 2019-08-02 DIAGNOSIS — F419 Anxiety disorder, unspecified: Secondary | ICD-10-CM

## 2019-08-02 MED ORDER — SERTRALINE HCL 50 MG PO TABS: 50.0000 mg | ORAL_TABLET | Freq: Every day | ORAL | 0 refills | Status: DC

## 2019-08-02 MED ORDER — LORAZEPAM 0.5 MG PO TABS: ORAL_TABLET | ORAL | 0 refills | Status: DC

## 2019-08-02 NOTE — ED Provider Notes (Signed)
[facility]    CSN: [id number] Arrival date & time: 08/02/19  1536      History   Chief Complaint Chief Complaint  Patient presents with  . Anxiety    HPI Brittany Horn is a 24 y.o. female.   24 yo female with a c/o anxiety for several years which has now worsened over the past several months due to life/family stressors. Denies any suicidal or homicidal ideation. States she has not been on any medications. States she's otherwise healthy. States when anxious she gets sweaty, has palpitations and shaky.    Anxiety    Past Medical History:  Diagnosis Date  . Arthritis   . PID (acute pelvic inflammatory disease)     Patient Active Problem List   Diagnosis Date Noted  . Dyspareunia due to medical condition in female 09/19/2017    History reviewed. No pertinent surgical history.  OB History    Gravida  3   Para  2   Term  2   Preterm      AB      Living  2     SAB      TAB      Ectopic      Multiple  0   Live Births  1            Home Medications    Prior to Admission medications   Medication Sig Start Date End Date Taking? Authorizing Provider  levonorgestrel (MIRENA) 20 MCG/24HR IUD 1 each by Intrauterine route once.   Yes [provider]  LORazepam (ATIVAN) 0.5 MG tablet 1 tab po prn anxiety/panic attack 08/02/19   Norval Gable, MD  sertraline (ZOLOFT) 50 MG tablet Take 1 tablet (50 mg total) by mouth daily. 08/02/19   Norval Gable, MD    Family History Family History  Problem Relation Age of Onset  . Healthy Mother   . Healthy Father     Social History Social History   Tobacco Use  . Smoking status: Never Smoker  . Smokeless tobacco: Never Used  Substance Use Topics  . Alcohol use: Yes    Comment: social  . Drug use: No     Allergies   Patient has no known allergies.   Review of Systems Review of Systems   Physical Exam Triage Vital Signs ED Triage Vitals  Enc Vitals Group     BP  08/02/19 1549 122/80     Pulse Rate 08/02/19 1549 70     Resp 08/02/19 1549 14     Temp 08/02/19 1549 98.3 F (36.8 C)     Temp Source 08/02/19 1549 Oral     SpO2 08/02/19 1549 100 %     Weight 08/02/19 1546 180 lb (81.6 kg)     Height 08/02/19 1546 5\' 8"  (1.727 m)     Head Circumference --      Peak Flow --      Pain Score 08/02/19 1546 0     Pain Loc --      Pain Edu? --      Excl. in [place]? --    No data found.  Updated Vital Signs BP 122/80 (BP Location: Left Arm)   Pulse 70   Temp 98.3 F (36.8 C) (Oral)   Resp 14   Ht 5\' 8"  (1.727 m)   Wt 81.6 kg   SpO2 100%   Breastfeeding No   BMI 27.37 kg/m   Visual Acuity Right Eye Distance:  Left Eye Distance:   Bilateral Distance:    Right Eye Near:   Left Eye Near:    Bilateral Near:     Physical Exam Vitals and nursing note reviewed.  Constitutional:      General: She is not in acute distress.    Appearance: She is not toxic-appearing or diaphoretic.  Cardiovascular:     Rate and Rhythm: Normal rate.  Pulmonary:     Effort: Pulmonary effort is normal. No respiratory distress.  Neurological:     Mental Status: She is alert.  Psychiatric:        Attention and Perception: Attention normal.        Mood and Affect: Mood is anxious and depressed.        Speech: Speech normal.        Behavior: Behavior normal.        Thought Content: Thought content normal.        Judgment: Judgment normal.      UC Treatments / Results  Labs (all labs ordered are listed, but only abnormal results are displayed) Labs Reviewed - No data to display  EKG   Radiology No results found.  Procedures Procedures (including critical care time)  Medications Ordered in UC Medications - No data to display  Initial Impression / Assessment and Plan / UC Course  I have reviewed the triage vital signs and the nursing notes.  Pertinent labs & imaging results that were available during my care of the patient were reviewed by me and  considered in my medical decision making (see chart for details).      Final Clinical Impressions(s) / UC Diagnoses   Final diagnoses:  Anxiety     Discharge Instructions     Follow up with primary care provider    ED Prescriptions    Medication Sig Dispense Auth. Provider   sertraline (ZOLOFT) 50 MG tablet Take 1 tablet (50 mg total) by mouth daily. 21 tablet Malichi Palardy, MD   LORazepam (ATIVAN) 0.5 MG tablet 1 tab po prn anxiety/panic attack 5 tablet Chandon Lazcano, Pamala Hurry, MD      1. diagnosis reviewed with patient 2. rx as per orders above; reviewed possible side effects, interactions, risks and benefits  3. Follow up with PCP 4. Follow-up prn if symptoms worsen or don't improve   I have reviewed the PDMP during this encounter.   Payton Mccallum, MD 08/02/19 Paulo Fruit

## 2019-08-02 NOTE — ED Triage Notes (Signed)
Patient states that she has been struggling with some anxiety for three years and would like to see someone for this.  Patient states that she has not been on any anxiety medicines in the past.  Patient states that she been under a lot of stress and certain family situations have contributed to her anxiety.

## 2019-08-02 NOTE — Discharge Instructions (Signed)
Follow up with primary care provider 

## 2020-02-12 DIAGNOSIS — J020 Streptococcal pharyngitis: Secondary | ICD-10-CM | POA: Diagnosis not present

## 2020-02-12 DIAGNOSIS — J029 Acute pharyngitis, unspecified: Secondary | ICD-10-CM | POA: Diagnosis not present

## 2020-11-26 ENCOUNTER — Encounter: Payer: Self-pay | Admitting: Obstetrics and Gynecology

## 2020-11-26 ENCOUNTER — Other Ambulatory Visit: Payer: Self-pay

## 2020-11-26 ENCOUNTER — Ambulatory Visit: Payer: Medicaid Other

## 2020-11-26 ENCOUNTER — Other Ambulatory Visit: Payer: Self-pay | Admitting: Obstetrics and Gynecology

## 2020-11-26 DIAGNOSIS — Z8481 Family history of carrier of genetic disease: Secondary | ICD-10-CM

## 2020-11-26 NOTE — Progress Notes (Addendum)
  Pt's mother, Shelaine Frie, 12/08/68, diagnosed with BARD1 and RAD51C genetic mutations. Pt and her mom and sister came to office today to go over results and mgmt options. Pt elects to have [facility] single site testing done. Sample collected and sent off. Will f/u with results.   12/18/20 Received notification from [employer] that they are considered OON with her [facility] Medicaid plan and appeal needs to be done from our end. I spoke to [facility] with [facility] [phone], call ref # [id number]). He gave me website for appeals process. Would need to mail documentation and then could take 30 days for response. Since it is an [place] lab and not an issue with [place], [employer] would probably also need to then send appeal.  Given that genes mother tested positive for don't start any med mgmt till age 5, pt and I decided to wait till her health insurance changes and then retry to get testing covered at a later date. Also, OON lab status may change. Pt to f/u when insurance changes. OR, pt can pay $249 self pay rate.

## 2021-01-04 DIAGNOSIS — J039 Acute tonsillitis, unspecified: Secondary | ICD-10-CM | POA: Diagnosis not present

## 2021-01-04 DIAGNOSIS — J028 Acute pharyngitis due to other specified organisms: Secondary | ICD-10-CM | POA: Diagnosis not present

## 2021-01-04 DIAGNOSIS — Z8709 Personal history of other diseases of the respiratory system: Secondary | ICD-10-CM | POA: Diagnosis not present

## 2021-01-04 DIAGNOSIS — J029 Acute pharyngitis, unspecified: Secondary | ICD-10-CM | POA: Diagnosis not present

## 2021-06-23 ENCOUNTER — Ambulatory Visit
Admission: EM | Admit: 2021-06-23 | Discharge: 2021-06-23 | Disposition: A | Discharge status: Home / Self Care | Payer: Medicaid Other | Attending: Emergency Medicine | Admitting: Emergency Medicine

## 2021-06-23 ENCOUNTER — Encounter: Payer: Self-pay | Admitting: Emergency Medicine

## 2021-06-23 ENCOUNTER — Other Ambulatory Visit: Payer: Self-pay

## 2021-06-23 DIAGNOSIS — J029 Acute pharyngitis, unspecified: Secondary | ICD-10-CM | POA: Diagnosis not present

## 2021-06-23 LAB — POCT RAPID STREP A (OFFICE): Rapid Strep A Screen: NEGATIVE

## 2021-06-23 MED ORDER — AMOXICILLIN 875 MG PO TABS: 875.0000 mg | ORAL_TABLET | Freq: Two times a day (BID) | ORAL | 0 refills | Status: AC

## 2021-06-23 NOTE — ED Provider Notes (Signed)
Roderic Palau    CSN: [id number] Arrival date & time: 06/23/21  7253      History   Chief Complaint Chief Complaint  Patient presents with   Sore Throat    HPI Brittany Horn is a 25 y.o. female.  Patient presents with sore throat since yesterday.  She reports history of frequent strep infections.  She denies fever, chills, drainage, shortness of breath, or other symptoms.  No treatments attempted at home.  The history is provided by the patient.   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 No pertinent surgical history.  OB History     Gravida  3   Para  2   Term  2   Preterm      AB      Living  2      SAB      IAB      Ectopic      Multiple  0   Live Births  1            Home Medications    Prior to Admission medications   Medication Sig Start Date End Date Taking? Authorizing Provider  amoxicillin (AMOXIL) 875 MG tablet Take 1 tablet (875 mg total) by mouth 2 (two) times daily for 7 days. 06/23/21 06/30/21 Yes Sharion Balloon, NP  levonorgestrel (MIRENA) 20 MCG/24HR IUD 1 each by Intrauterine route once.    [provider]  LORazepam (ATIVAN) 0.5 MG tablet 1 tab po prn anxiety/panic attack 08/02/19   Norval Gable, MD  sertraline (ZOLOFT) 50 MG tablet Take 1 tablet (50 mg total) by mouth daily. 08/02/19   Norval Gable, MD    Family History Family History  Problem Relation Age of Onset   Other Mother 58       BARD1 gene mutation positive   Healthy Father    Uterine cancer Maternal Grandmother 12   Kidney cancer Paternal Grandmother    Lung cancer Paternal Grandmother    Breast cancer Maternal Aunt        41s   Ovarian cancer Maternal Aunt        62s   Brain cancer Paternal Aunt        60s   Breast cancer Paternal Aunt         51s   Breast cancer Paternal Aunt 67    Social History Social History   Tobacco Use   Smoking status: Never   Smokeless tobacco: Never  Vaping Use   Vaping Use: Never used  Substance Use Topics   Alcohol use: Yes    Comment: social   Drug use: No     Allergies   Patient has no known allergies.   Review of Systems Review of Systems  Constitutional:  Negative for chills and fever.  HENT:  Positive for sore throat. Negative for ear pain.   Respiratory:  Negative for cough and shortness of breath.   Cardiovascular:  Negative for chest pain and palpitations.  Gastrointestinal:  Negative for diarrhea and vomiting.  Skin:  Negative for color change and rash.  All other systems reviewed and are negative.   Physical Exam Triage Vital Signs ED Triage Vitals  Enc Vitals Group     BP  Pulse      Resp      Temp      Temp src      SpO2      Weight      Height      Head Circumference      Peak Flow      Pain Score      Pain Loc      Pain Edu?      Excl. in [place]?    No data found.  Updated Vital Signs BP 113/75 (BP Location: Left Arm)   Pulse 88   Temp 98.5 F (36.9 C) (Oral)   Resp 18   SpO2 97%   Visual Acuity Right Eye Distance:   Left Eye Distance:   Bilateral Distance:    Right Eye Near:   Left Eye Near:    Bilateral Near:     Physical Exam Vitals and nursing note reviewed.  Constitutional:      General: She is not in acute distress.    Appearance: She is well-developed. She is ill-appearing.  HENT:     Right Ear: Tympanic membrane normal.     Left Ear: Tympanic membrane normal.     Nose: Nose normal.     Mouth/Throat:     Mouth: Mucous membranes are moist.     Pharynx: Posterior oropharyngeal erythema present.     Comments: Posterior pharynx beefy red. Cardiovascular:     Rate and Rhythm: Normal rate and regular rhythm.     Heart sounds: No murmur heard. Pulmonary:     Effort: Pulmonary effort is normal. No respiratory distress.      Breath sounds: Normal breath sounds.  Abdominal:     Palpations: Abdomen is soft.     Tenderness: There is no abdominal tenderness.  Musculoskeletal:     Cervical back: Neck supple.  Skin:    General: Skin is warm and dry.  Neurological:     Mental Status: She is alert.  Psychiatric:        Mood and Affect: Mood normal.        Behavior: Behavior normal.     UC Treatments / Results  Labs (all labs ordered are listed, but only abnormal results are displayed) Labs Reviewed  CULTURE, GROUP A STREP [facility])  POCT RAPID STREP A (OFFICE)    EKG   Radiology No results found.  Procedures Procedures (including critical care time)  Medications Ordered in UC Medications - No data to display  Initial Impression / Assessment and Plan / UC Course  I have reviewed the triage vital signs and the nursing notes.  Pertinent labs & imaging results that were available during my care of the patient were reviewed by me and considered in my medical decision making (see chart for details).   Sore throat.  Rapid strep negative; culture pending.  Patient reports history of frequent strep throat.  Based on exam today, starting on amoxicillin.  Discussed that we will call if throat culture is negative and instruct her to stop taking the antibiotic.  Discussed Tylenol or ibuprofen as needed for discomfort.  Instructed her to follow-up with her PCP if her symptoms are not improving.  She agrees to plan of care.   Final Clinical Impressions(s) / UC Diagnoses   Final diagnoses:  Sore throat     Discharge Instructions      Take the amoxicillin as directed.  Follow up with your primary care provider if your symptoms are not improving.  ED Prescriptions     Medication Sig Dispense Auth. Provider   amoxicillin (AMOXIL) 875 MG tablet Take 1 tablet (875 mg total) by mouth 2 (two) times daily for 7 days. 14 tablet Sharion Balloon, NP      PDMP not reviewed this encounter.   Sharion Balloon, NP 06/23/21 [phone]

## 2021-06-23 NOTE — ED Triage Notes (Signed)
Pt here with painful, red sore throat since last night. States she gets strep twice a year and this feels like that.

## 2021-06-23 NOTE — Discharge Instructions (Addendum)
Take the amoxicillin as directed.  Follow up with your primary care provider if your symptoms are not improving.   ° ° °

## 2021-06-24 ENCOUNTER — Ambulatory Visit: Payer: Self-pay

## 2021-06-25 LAB — CULTURE, GROUP A STREP (THRC)

## 2021-10-14 ENCOUNTER — Ambulatory Visit: Payer: Medicaid Other | Admitting: Family Medicine

## 2021-10-14 ENCOUNTER — Other Ambulatory Visit: Payer: Self-pay

## 2021-10-14 ENCOUNTER — Ambulatory Visit
Admission: EM | Admit: 2021-10-14 | Discharge: 2021-10-14 | Disposition: A | Discharge status: Home / Self Care | Payer: Medicaid Other | Attending: Student | Admitting: Student

## 2021-10-14 DIAGNOSIS — Z975 Presence of (intrauterine) contraceptive device: Secondary | ICD-10-CM | POA: Diagnosis present

## 2021-10-14 DIAGNOSIS — B9689 Other specified bacterial agents as the cause of diseases classified elsewhere: Secondary | ICD-10-CM | POA: Insufficient documentation

## 2021-10-14 DIAGNOSIS — Z113 Encounter for screening for infections with a predominantly sexual mode of transmission: Secondary | ICD-10-CM | POA: Diagnosis not present

## 2021-10-14 DIAGNOSIS — N760 Acute vaginitis: Secondary | ICD-10-CM | POA: Diagnosis not present

## 2021-10-14 DIAGNOSIS — N921 Excessive and frequent menstruation with irregular cycle: Secondary | ICD-10-CM

## 2021-10-14 DIAGNOSIS — N939 Abnormal uterine and vaginal bleeding, unspecified: Secondary | ICD-10-CM | POA: Diagnosis not present

## 2021-10-14 LAB — WET PREP, GENITAL
Sperm: NONE SEEN
Trich, Wet Prep: NONE SEEN
WBC, Wet Prep HPF POC: <10 — AB (ref <10)
Yeast Wet Prep HPF POC: NONE SEEN

## 2021-10-14 MED ORDER — METRONIDAZOLE 500 MG PO TABS: 500.0000 mg | ORAL_TABLET | Freq: Two times a day (BID) | ORAL | 0 refills | Status: DC

## 2021-10-14 MED ORDER — MEGESTROL ACETATE 40 MG PO TABS: 40.0000 mg | ORAL_TABLET | Freq: Two times a day (BID) | ORAL | 0 refills | Status: AC

## 2021-10-14 NOTE — ED Provider Notes (Signed)
?[facility] ? ? ? ?CSN: [id number] ?Arrival date & time: 10/14/21  4098 ? ? ?  ? ?History   ?Chief Complaint ?Chief Complaint  ?Patient presents with  ? Vaginal Bleeding  ? ? ?HPI ?Brittany Horn is a 26 y.o. female presenting with vaginal bleeding.  Describes intermittent vaginal bleeding for 3 years, and has been bleeding for approximately 3 weeks of this month.  States it seemed like the period was stopping, but then came back this morning.  Denies associated abdominal pain, flank pain, fever/chills.  She does note a new vaginal odor, and some discharge; unsure of color given the bleeding.  States she called her OB/GYN who placed the IUD, they cannot see her for a few weeks and so she presented to the urgent care.  Denies STI risk. ? ?HPI ? ?Past Medical History:  ?Diagnosis Date  ? Arthritis   ? PID (acute pelvic inflammatory disease)   ? ? ?Patient Active Problem List  ? Diagnosis Date Noted  ? 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 ? ?History reviewed. No pertinent surgical history. ? ?OB History   ? ? Gravida  ?3  ? Para  ?2  ? Term  ?2  ? Preterm  ?   ? AB  ?   ? Living  ?2  ?  ? ? SAB  ?   ? IAB  ?   ? Ectopic  ?   ? Multiple  ?0  ? Live Births  ?1  ?   ?  ?  ? ? ? ?Home Medications   ? ?Prior to Admission medications   ?Medication Sig Start Date End Date Taking? Authorizing Provider  ?megestrol (MEGACE) 40 MG tablet Take 1 tablet (40 mg total) by mouth 2 (two) times daily for 21 days. 10/14/21 11/04/21 Yes Hazel Sams, PA-C  ?metroNIDAZOLE (FLAGYL) 500 MG tablet Take 1 tablet (500 mg total) by mouth 2 (two) times daily. Avoid alcohol while taking this medication and for 2 days after 10/14/21  Yes Hazel Sams, PA-C  ?levonorgestrel (MIRENA) 20 MCG/24HR IUD 1 each by Intrauterine route once.    [provider]  ? ? ?Family History ?Family History  ?Problem Relation Age of Onset  ? Other Mother 49  ?     BARD1 gene mutation positive  ? Healthy Father   ? Uterine  cancer Maternal Grandmother 38  ? Kidney cancer Paternal Grandmother   ? Lung cancer Paternal Grandmother   ? Breast cancer Maternal Aunt   ?     25s  ? Ovarian cancer Maternal Aunt   ?     82s  ? Brain cancer Paternal Aunt   ?     59s  ? Breast cancer Paternal Aunt   ?     12s  ? Breast cancer Paternal Aunt 8  ? ? ?Social History ?Social History  ? ?Tobacco Use  ? Smoking status: Never  ? Smokeless tobacco: Never  ?Vaping Use  ? Vaping Use: Never used  ?Substance Use Topics  ? Alcohol use: Yes  ?  Comment: social  ? Drug use: No  ? ? ? ?Allergies   ?Patient has no known allergies. ? ? ?Review of Systems ?Review of Systems  ?Constitutional:  Negative for chills and fever.  ?HENT:  Negative for sore throat.   ?Eyes:  Negative for pain and redness.  ?Respiratory:  Negative for shortness of breath.   ?Cardiovascular:  Negative for chest  pain.  ?Gastrointestinal:  Negative for abdominal pain, diarrhea, nausea and vomiting.  ?Genitourinary:  Positive for vaginal bleeding. Negative for decreased urine volume, difficulty urinating, dysuria, flank pain, frequency, genital sores, hematuria and urgency.  ?Musculoskeletal:  Negative for back pain.  ?Skin:  Negative for rash.  ?All other systems reviewed and are negative. ? ? ?Physical Exam ?Triage Vital Signs ?ED Triage Vitals  ?Enc Vitals Group  ?   BP 10/14/21 0935 116/72  ?   Pulse Rate 10/14/21 0935 69  ?   Resp 10/14/21 0935 17  ?   Temp 10/14/21 0935 97.9 ?F (36.6 ?C)  ?   Temp Source 10/14/21 0935 Oral  ?   SpO2 10/14/21 0935 99 %  ?   Weight --   ?   Height --   ?   Head Circumference --   ?   Peak Flow --   ?   Pain Score 10/14/21 0932 0  ?   Pain Loc --   ?   Pain Edu? --   ?   Excl. in [place]? --   ? ?No data found. ? ?Updated Vital Signs ?BP 116/72 (BP Location: Left Arm)   Pulse 69   Temp 97.9 ?F (36.6 ?C) (Oral)   Resp 17   LMP 09/25/2021   SpO2 99%  ? ?Visual Acuity ?Right Eye Distance:   ?Left Eye Distance:   ?Bilateral Distance:   ? ?Right Eye Near:   ?Left  Eye Near:    ?Bilateral Near:    ? ?Physical Exam ?Vitals reviewed.  ?Constitutional:   ?   General: She is not in acute distress. ?   Appearance: Normal appearance. She is not ill-appearing.  ?HENT:  ?   Head: Normocephalic and atraumatic.  ?   Mouth/Throat:  ?   Mouth: Mucous membranes are moist.  ?   Comments: Moist mucous membranes ?Eyes:  ?   Extraocular Movements: Extraocular movements intact.  ?   Pupils: Pupils are equal, round, and reactive to light.  ?Cardiovascular:  ?   Rate and Rhythm: Normal rate and regular rhythm.  ?   Heart sounds: Normal heart sounds.  ?Pulmonary:  ?   Effort: Pulmonary effort is normal.  ?   Breath sounds: Normal breath sounds. No wheezing, rhonchi or rales.  ?Abdominal:  ?   General: Bowel sounds are normal. There is no distension.  ?   Palpations: Abdomen is soft. There is no mass.  ?   Tenderness: There is no abdominal tenderness. There is no right CVA tenderness, left CVA tenderness, guarding or rebound.  ?Genitourinary: ?   Comments: deferred ?Skin: ?   General: Skin is warm.  ?   Capillary Refill: Capillary refill takes less than 2 seconds.  ?   Comments: Good skin turgor  ?Neurological:  ?   General: No focal deficit present.  ?   Mental Status: She is alert and oriented to person, place, and time.  ?Psychiatric:     ?   Mood and Affect: Mood normal.     ?   Behavior: Behavior normal.  ? ? ? ?UC Treatments / Results  ?Labs ?(all labs ordered are listed, but only abnormal results are displayed) ?Labs Reviewed  ?WET PREP, GENITAL - Abnormal; Notable for the following components:  ?    Result Value  ? Clue Cells Wet Prep HPF POC PRESENT (*)   ? WBC, Wet Prep HPF POC <10 (*)   ? All other components within normal  limits  ?CERVICOVAGINAL ANCILLARY ONLY  ? ? ?EKG ? ? ?Radiology ?No results found. ? ?Procedures ?Procedures (including critical care time) ? ?Medications Ordered in UC ?Medications - No data to display ? ?Initial Impression / Assessment and Plan / UC Course  ?I have  reviewed the triage vital signs and the nursing notes. ? ?Pertinent labs & imaging results that were available during my care of the patient were reviewed by me and considered in my medical decision making (see chart for details). ? ?  ? ?This patient is a very pleasant 26 y.o. year old female presenting with vaginitis and AUB related to hormonal IUD. Afebrile, nontachycardic, no reproducible abd pain or CVAT. ? ?Wet prep with clue cells. Will manage for BV with flagyl. Also sent Megace. She has an appt with GYN in about 3 weeks; rec calling to move this sooner if possible.  ? ?Strict ED return precautions discussed. Patient verbalizes understanding and agreement.  ?.  ? ?Final Clinical Impressions(s) / UC Diagnoses  ? ?Final diagnoses:  ?Abnormal uterine bleeding (AUB)  ?BV (bacterial vaginosis)  ?IUD contraception  ? ? ? ?Discharge Instructions   ? ?  ?-Megace twice daily for up to 3 weeks. This will stop your period. Try to follow-up with your OB-GYN before you complete this.  ?-For bacterial vaginosis, start the antibiotic-Flagyl (metronidazole), 2 pills daily for 7 days.  You can take this with food if you have a sensitive stomach.  Avoid alcohol while taking this medication and for 2 days after as this will cause severe nausea and vomiting. ?-We have sent testing for sexually transmitted infections. We will notify you of any additional positive results once they are received. If required, we will prescribe any medications you might need. Please refrain from all sexual activity until treatment is complete.  ?-Seek additional medical attention if you develop fevers/chills, new/worsening abdominal pain, new/worsening vaginal discomfort/discharge, etc.  ? ? ? ?ED Prescriptions   ? ? Medication Sig Dispense Auth. Provider  ? megestrol (MEGACE) 40 MG tablet Take 1 tablet (40 mg total) by mouth 2 (two) times daily for 21 days. 42 tablet Hazel Sams, PA-C  ? metroNIDAZOLE (FLAGYL) 500 MG tablet Take 1 tablet (500  mg total) by mouth 2 (two) times daily. Avoid alcohol while taking this medication and for 2 days after 14 tablet Hazel Sams, PA-C  ? ?  ? ?PDMP not reviewed this encounter. ?  ?Hazel Sams, PA-C

## 2021-10-14 NOTE — Discharge Instructions (Addendum)
-  Megace twice daily for up to 3 weeks. This will stop your period. Try to follow-up with your OB-GYN before you complete this.  ?-For bacterial vaginosis, start the antibiotic-Flagyl (metronidazole), 2 pills daily for 7 days.  You can take this with food if you have a sensitive stomach.  Avoid alcohol while taking this medication and for 2 days after as this will cause severe nausea and vomiting. ?-We have sent testing for sexually transmitted infections. We will notify you of any additional positive results once they are received. If required, we will prescribe any medications you might need. Please refrain from all sexual activity until treatment is complete.  ?-Seek additional medical attention if you develop fevers/chills, new/worsening abdominal pain, new/worsening vaginal discomfort/discharge, etc.  ? ?

## 2021-10-14 NOTE — Progress Notes (Signed)
Pt walked in to clinic today with complaints of bleeding IUD for ~ 3 years and off and on bleeding for 3 weeks.   ?Using 4-5 tampons daily.   ? ?Pt reports not contacting clinic that placed IUD to inform of bleeding or problems with IUD.  ? ?Recommended pt to see UC or ED for imaging to insure IUD is in place.   ? ?Pt verbalized understanding.   ? ?Junious Dresser, FNP ? ?

## 2021-10-14 NOTE — ED Triage Notes (Signed)
Pt is here with vaginal bleeding. She had an IUD placed 3 years ago, and ever since its been constant bleeding. She bled from 3/11-3/28 then it started back this morning.  ?

## 2021-10-15 LAB — CERVICOVAGINAL ANCILLARY ONLY
Bacterial Vaginitis (gardnerella): POSITIVE — AB
Candida Glabrata: NEGATIVE
Candida Vaginitis: NEGATIVE
Chlamydia: NEGATIVE
Comment: NEGATIVE
Comment: NEGATIVE
Comment: NEGATIVE
Comment: NEGATIVE
Comment: NEGATIVE
Comment: NORMAL
Neisseria Gonorrhea: NEGATIVE
Trichomonas: NEGATIVE

## 2021-10-20 ENCOUNTER — Ambulatory Visit: Payer: Medicaid Other | Admitting: Obstetrics

## 2021-11-28 ENCOUNTER — Ambulatory Visit
Admission: EM | Admit: 2021-11-28 | Discharge: 2021-11-28 | Disposition: A | Discharge status: Home / Self Care | Payer: Medicaid Other | Attending: Physician Assistant | Admitting: Physician Assistant

## 2021-11-28 ENCOUNTER — Encounter: Payer: Self-pay | Admitting: Emergency Medicine

## 2021-11-28 ENCOUNTER — Other Ambulatory Visit: Payer: Self-pay

## 2021-11-28 DIAGNOSIS — R21 Rash and other nonspecific skin eruption: Secondary | ICD-10-CM | POA: Diagnosis not present

## 2021-11-28 MED ORDER — KETOCONAZOLE 2 % EX CREA: 1.0000 "application " | TOPICAL_CREAM | Freq: Two times a day (BID) | CUTANEOUS | 0 refills | Status: AC

## 2021-11-28 MED ORDER — FLUCONAZOLE 150 MG PO TABS: ORAL_TABLET | ORAL | 0 refills | Status: DC

## 2021-11-28 NOTE — Discharge Instructions (Signed)
-  Your rash is consistent with body ringworm especially since it occurred after you were in a dirty pool.  I have sent a cream to the pharmacy as well as an oral medication.  You can continue the antihistamines for itching. ?- Consider clean yourself with head and shoulder shampoo.  This often helps with fungal infections. ?- The other condition we talked about that could be a cause of the rash some ago pityriasis rosea which is a viral illness.  Have included more information about it.  It is a self-limiting condition. ?

## 2021-11-28 NOTE — ED Provider Notes (Signed)
?[facility] ? ? ? ?CSN: [id number] ?Arrival date & time: 11/28/21  1222 ? ? ?  ? ?History   ?Chief Complaint ?Chief Complaint  ?Patient presents with  ? Rash  ? ? ?HPI ?Brittany Horn is a 26 y.o. female presenting for rash of her chest and abdomen that started 5 days ago.  Patient says rash started right after she got into a pool and not been treated.  She says that water was discolored and dirty.  Reports the rash is a little bit itchy but not painful.  She has not had any associated flulike symptoms.  No fever, joint aches, headaches and body aches, cough or congestion.  Has been using over-the-counter hydrocortisone cream but thinks that made it worse.  Patient also reports that she has 2 areas of tick bites on her abdomen 2 and does not know if that is related.  No other complaints. ? ?HPI ? ?Past Medical History:  ?Diagnosis Date  ? Arthritis   ? PID (acute pelvic inflammatory disease)   ? ? ?Patient Active Problem List  ? Diagnosis Date Noted  ? 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 ? ?History reviewed. No pertinent surgical history. ? ?OB History   ? ? Gravida  ?3  ? Para  ?2  ? Term  ?2  ? Preterm  ?   ? AB  ?   ? Living  ?2  ?  ? ? SAB  ?   ? IAB  ?   ? Ectopic  ?   ? Multiple  ?0  ? Live Births  ?1  ?   ?  ?  ? ? ? ?Home Medications   ? ?Prior to Admission medications   ?Medication Sig Start Date End Date Taking? Authorizing Provider  ?fluconazole (DIFLUCAN) 150 MG tablet Take 1 tab po q72h 11/28/21  Yes Laurene Footman B, PA-C  ?ketoconazole (NIZORAL) 2 % cream Apply 1 application. topically 2 (two) times daily for 14 days. 11/28/21 12/12/21 Yes Danton Clap, PA-C  ?levonorgestrel (MIRENA) 20 MCG/24HR IUD 1 each by Intrauterine route once.    [provider]  ?metroNIDAZOLE (FLAGYL) 500 MG tablet Take 1 tablet (500 mg total) by mouth 2 (two) times daily. Avoid alcohol while taking this medication and for 2 days after 10/14/21   Hazel Sams, PA-C  ? ? ?Family  History ?Family History  ?Problem Relation Age of Onset  ? Other Mother 12  ?     BARD1 gene mutation positive  ? Healthy Father   ? Uterine cancer Maternal Grandmother 63  ? Kidney cancer Paternal Grandmother   ? Lung cancer Paternal Grandmother   ? Breast cancer Maternal Aunt   ?     6s  ? Ovarian cancer Maternal Aunt   ?     32s  ? Brain cancer Paternal Aunt   ?     59s  ? Breast cancer Paternal Aunt   ?     65s  ? Breast cancer Paternal Aunt 47  ? ? ?Social History ?Social History  ? ?Tobacco Use  ? Smoking status: Never  ? Smokeless tobacco: Never  ?Vaping Use  ? Vaping Use: Never used  ?Substance Use Topics  ? Alcohol use: Yes  ?  Comment: social  ? Drug use: No  ? ? ? ?Allergies   ?Patient has no known allergies. ? ? ?Review of Systems ?Review of Systems  ?Constitutional:  Negative for fatigue and  fever.  ?HENT:  Negative for congestion, rhinorrhea and sore throat.   ?Respiratory:  Negative for cough.   ?Gastrointestinal:  Negative for nausea and vomiting.  ?Musculoskeletal:  Negative for arthralgias and myalgias.  ?Skin:  Positive for rash.  ?Neurological:  Negative for headaches.  ? ? ?Physical Exam ?Triage Vital Signs ?ED Triage Vitals  ?Enc Vitals Group  ?   BP 11/28/21 1247 115/77  ?   Pulse Rate 11/28/21 1247 75  ?   Resp 11/28/21 1247 14  ?   Temp 11/28/21 1247 98.2 ?F (36.8 ?C)  ?   Temp Source 11/28/21 1247 Oral  ?   SpO2 11/28/21 1247 100 %  ?   Weight 11/28/21 1244 190 lb (86.2 kg)  ?   Height 11/28/21 1244 _0  (1.727 m)  ?   Head Circumference --   ?   Peak Flow --   ?   Pain Score 11/28/21 1244 0  ?   Pain Loc --   ?   Pain Edu? --   ?   Excl. in [place]? --   ? ?No data found. ? ?Updated Vital Signs ?BP 115/77 (BP Location: Left Arm)   Pulse 75   Temp 98.2 ?F (36.8 ?C) (Oral)   Resp 14   Ht _1  (1.727 m)   Wt 190 lb (86.2 kg)   SpO2 100%   BMI 28.89 kg/m?  ?   ? ?Physical Exam ?Vitals and nursing note reviewed.  ?Constitutional:   ?   General: She is not in acute distress. ?   Appearance:  Normal appearance. She is not ill-appearing or toxic-appearing.  ?HENT:  ?   Head: Normocephalic and atraumatic.  ?   Nose: Nose normal.  ?   Mouth/Throat:  ?   Mouth: Mucous membranes are moist.  ?   Pharynx: Oropharynx is clear.  ?Eyes:  ?   General: No scleral icterus.    ?   Right eye: No discharge.     ?   Left eye: No discharge.  ?   Conjunctiva/sclera: Conjunctivae normal.  ?Cardiovascular:  ?   Rate and Rhythm: Normal rate and regular rhythm.  ?   Heart sounds: Normal heart sounds.  ?Pulmonary:  ?   Effort: Pulmonary effort is normal. No respiratory distress.  ?   Breath sounds: Normal breath sounds.  ?Musculoskeletal:  ?   Cervical back: Neck supple.  ?Skin: ?   General: Skin is dry.  ?   Findings: Rash present.  ?   Comments: There is a quarter sized area of tan ovular rash with central clearing of the left side of the abdomen.  There is surrounding flat and some raised light pink and slightly erythematous circular and ovular lesions.  Rash covers abdomen and chest.  Also small area on her neck.  ?Neurological:  ?   General: No focal deficit present.  ?   Mental Status: She is alert. Mental status is at baseline.  ?   Motor: No weakness.  ?   Gait: Gait normal.  ?Psychiatric:     ?   Mood and Affect: Mood normal.     ?   Behavior: Behavior normal.     ?   Thought Content: Thought content normal.  ? ? ? ?UC Treatments / Results  ?Labs ?(all labs ordered are listed, but only abnormal results are displayed) ?Labs Reviewed - No data to display ? ?EKG ? ? ?Radiology ?No results found. ? ?Procedures ?Procedures (including  critical care time) ? ?Medications Ordered in UC ?Medications - No data to display ? ?Initial Impression / Assessment and Plan / UC Course  ?I have reviewed the triage vital signs and the nursing notes. ? ?Pertinent labs & imaging results that were available during my care of the patient were reviewed by me and considered in my medical decision making (see chart for details). ? ?26 year old  female presenting for pruritic rash of chest and abdomen over the past 5 days.  Rash started the day after she got out of a dirty nontreated pool.  Also reports being bitten by a tick about the same time.  No bull's-eye type rashes.  No fevers, flulike symptoms.  On exam her rash is characteristic of tinea corporis but could also resemble pityriasis rosea since she has 1 larger patch than the rest and that is where the rash began.  Discussed both possibilities with patient.  Advised her pityriasis rosea is self-limiting and will need to just once course.  We will treat her as if it is tinea corporis.  This seems most likely since it started after she got out of a dirty and untreated pool.  Sent ketoconazole cream as well as a couple Diflucan pills.  Reviewed good hygiene.  Reviewed return and ER precautions. ? ? ?Final Clinical Impressions(s) / UC Diagnoses  ? ?Final diagnoses:  ?Rash and nonspecific skin eruption  ? ? ? ?Discharge Instructions   ? ?  ?-Your rash is consistent with body ringworm especially since it occurred after you were in a dirty pool.  I have sent a cream to the pharmacy as well as an oral medication.  You can continue the antihistamines for itching. ?- Consider clean yourself with head and shoulder shampoo.  This often helps with fungal infections. ?- The other condition we talked about that could be a cause of the rash some ago pityriasis rosea which is a viral illness.  Have included more information about it.  It is a self-limiting condition. ? ? ? ? ?ED Prescriptions   ? ? Medication Sig Dispense Auth. Provider  ? ketoconazole (NIZORAL) 2 % cream Apply 1 application. topically 2 (two) times daily for 14 days. 60 g Laurene Footman B, PA-C  ? fluconazole (DIFLUCAN) 150 MG tablet Take 1 tab po q72h 3 tablet Danton Clap, PA-C  ? ?  ? ?PDMP not reviewed this encounter. ?  ?Danton Clap, PA-C ?11/28/21 1349 ? ?

## 2021-11-28 NOTE — ED Triage Notes (Signed)
Patient reports getting into a pool that had not been treated on Monday and developed a rash on her abdomen.  Patient also reports some redness around 2 tick bite areas that she had removed earlier in the week.  Patient denies fevers.  ?

## 2021-11-30 ENCOUNTER — Ambulatory Visit
Admission: EM | Admit: 2021-11-30 | Discharge: 2021-11-30 | Disposition: A | Discharge status: Home / Self Care | Payer: Medicaid Other | Attending: Emergency Medicine | Admitting: Emergency Medicine

## 2021-11-30 DIAGNOSIS — R21 Rash and other nonspecific skin eruption: Secondary | ICD-10-CM | POA: Diagnosis not present

## 2021-11-30 MED ORDER — PREDNISONE 10 MG (21) PO TBPK: ORAL_TABLET | Freq: Every day | ORAL | 0 refills | Status: DC

## 2021-11-30 NOTE — ED Provider Notes (Signed)
?[facility] ? ? ? ?CSN: [id number] ?Arrival date & time: 11/30/21  1656 ? ? ?  ? ?History   ?Chief Complaint ?Chief Complaint  ?Patient presents with  ? Rash  ?  Was seen at Me and locationDiagnosis was ringworm or PityriasisPrescribed FluconazoleRash has spread!! - Entered by patient  ? ? ?HPI ?Brittany Horn is a 26 y.o. female.  Patient presents with a rash x1 week.  She was seen at [facility] urgent care on 11/28/2021; diagnosed with rash; treated with Diflucan and ketoconazole cream; the provider notes that she discussed with the patient that the rash could be pityriasis rosea versus fungal; verbal education was provided on both.  The rash has not improved and is spreading.  It has become more pruritic.  She denies fever, chills, sore throat, cough, shortness of breath, vomiting, diarrhea, or other symptoms.  Patient states that she is the maid of honor in a wedding in 2 weeks; she will be wearing a spaghetti strap dress and would like the rash to be resolved by then.  ? ?The history is provided by the patient and medical records.  ? ?Past Medical History:  ?Diagnosis Date  ? Arthritis   ? PID (acute pelvic inflammatory disease)   ? ? ?Patient Active Problem List  ? Diagnosis Date Noted  ? 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 ? ?History reviewed. No pertinent surgical history. ? ?OB History   ? ? Gravida  ?3  ? Para  ?2  ? Term  ?2  ? Preterm  ?   ? AB  ?   ? Living  ?2  ?  ? ? SAB  ?   ? IAB  ?   ? Ectopic  ?   ? Multiple  ?0  ? Live Births  ?1  ?   ?  ?  ? ? ? ?Home Medications   ? ?Prior to Admission medications   ?Medication Sig Start Date End Date Taking? Authorizing Provider  ?predniSONE (STERAPRED UNI-PAK 21 TAB) 10 MG (21) TBPK tablet Take by mouth daily. As directed 11/30/21  Yes Sharion Balloon, NP  ?fluconazole (DIFLUCAN) 150 MG tablet Take 1 tab po q72h 11/28/21   Laurene Footman B, PA-C  ?ketoconazole (NIZORAL) 2 % cream Apply 1 application. topically 2 (two) times daily for  14 days. 11/28/21 12/12/21  Danton Clap, PA-C  ?levonorgestrel (MIRENA) 20 MCG/24HR IUD 1 each by Intrauterine route once.    [provider]  ?metroNIDAZOLE (FLAGYL) 500 MG tablet Take 1 tablet (500 mg total) by mouth 2 (two) times daily. Avoid alcohol while taking this medication and for 2 days after 10/14/21   Hazel Sams, PA-C  ? ? ?Family History ?Family History  ?Problem Relation Age of Onset  ? Other Mother 19  ?     BARD1 gene mutation positive  ? Healthy Father   ? Uterine cancer Maternal Grandmother 75  ? Kidney cancer Paternal Grandmother   ? Lung cancer Paternal Grandmother   ? Breast cancer Maternal Aunt   ?     15s  ? Ovarian cancer Maternal Aunt   ?     12s  ? Brain cancer Paternal Aunt   ?     65s  ? Breast cancer Paternal Aunt   ?     63s  ? Breast cancer Paternal Aunt 65  ? ? ?Social History ?Social History  ? ?Tobacco Use  ? Smoking status: Never  ?  Smokeless tobacco: Never  ?Vaping Use  ? Vaping Use: Never used  ?Substance Use Topics  ? Alcohol use: Yes  ?  Comment: social  ? Drug use: No  ? ? ? ?Allergies   ?Patient has no known allergies. ? ? ?Review of Systems ?Review of Systems  ?Constitutional:  Negative for chills and fever.  ?HENT:  Negative for ear pain and sore throat.   ?Respiratory:  Negative for cough and shortness of breath.   ?Gastrointestinal:  Negative for diarrhea and vomiting.  ?Skin:  Positive for rash. Negative for color change.  ?All other systems reviewed and are negative. ? ? ?Physical Exam ?Triage Vital Signs ?ED Triage Vitals  ?Enc Vitals Group  ?   BP   ?   Pulse   ?   Resp   ?   Temp   ?   Temp src   ?   SpO2   ?   Weight   ?   Height   ?   Head Circumference   ?   Peak Flow   ?   Pain Score   ?   Pain Loc   ?   Pain Edu?   ?   Excl. in [place]?   ? ?No data found. ? ?Updated Vital Signs ?BP 110/73   Pulse 64   Temp 97.9 ?F (36.6 ?C)   Resp 18   SpO2 100%  ? ?Visual Acuity ?Right Eye Distance:   ?Left Eye Distance:   ?Bilateral Distance:   ? ?Right Eye  Near:   ?Left Eye Near:    ?Bilateral Near:    ? ?Physical Exam ?Vitals and nursing note reviewed.  ?Constitutional:   ?   General: She is not in acute distress. ?   Appearance: Normal appearance. She is well-developed. She is not ill-appearing.  ?HENT:  ?   Mouth/Throat:  ?   Mouth: Mucous membranes are moist.  ?Eyes:  ?   Conjunctiva/sclera: Conjunctivae normal.  ?Cardiovascular:  ?   Rate and Rhythm: Normal rate and regular rhythm.  ?   Heart sounds: Normal heart sounds.  ?Pulmonary:  ?   Effort: Pulmonary effort is normal. No respiratory distress.  ?   Breath sounds: Normal breath sounds.  ?Musculoskeletal:  ?   Cervical back: Neck supple.  ?Skin: ?   General: Skin is warm and dry.  ?   Findings: Rash present.  ?   Comments: Pink and red annular lesions on trunk and upper arms. See pictures.   ?Neurological:  ?   Mental Status: She is alert.  ?Psychiatric:     ?   Mood and Affect: Mood normal.     ?   Behavior: Behavior normal.  ? ? ? ? ? ? ?UC Treatments / Results  ?Labs ?(all labs ordered are listed, but only abnormal results are displayed) ?Labs Reviewed - No data to display ? ?EKG ? ? ?Radiology ?No results found. ? ?Procedures ?Procedures (including critical care time) ? ?Medications Ordered in UC ?Medications - No data to display ? ?Initial Impression / Assessment and Plan / UC Course  ?I have reviewed the triage vital signs and the nursing notes. ? ?Pertinent labs & imaging results that were available during my care of the patient were reviewed by me and considered in my medical decision making (see chart for details). ? ?Rash.  This rash appears likely to be pityriasis rosea.  Because the rash is so pruritic, treating with prednisone taper.  Discussed Zyrtec  or Benadryl.  Education provided to patient on pityriasis rosea.  Instructed her to follow-up with her PCP or dermatologist if her symptoms are not improving. ? ? ?Final Clinical Impressions(s) / UC Diagnoses  ? ?Final diagnoses:  ?Rash   ? ? ? ?Discharge Instructions   ? ?  ?Take Zyrtec or Benadryl as discussed for itching.   Take the prednisone as directed.  Follow up with your primary care provider or a dermatologist if your symptoms are not improving.   ? ? ? ? ? ?ED Prescriptions   ? ? Medication Sig Dispense Auth. Provider  ? predniSONE (STERAPRED UNI-PAK 21 TAB) 10 MG (21) TBPK tablet Take by mouth daily. As directed 21 tablet Sharion Balloon, NP  ? ?  ? ?PDMP not reviewed this encounter. ?  ?Sharion Balloon, NP ?11/30/21 1744 ? ?

## 2021-11-30 NOTE — ED Triage Notes (Signed)
Patient presents to Urgent Care for follow-up from 05/14 visit at [facility]. She states she has a rash that has continued to spread. Treating rash with antifungal cream, nail polish, and fluconazole. So far has only taken one dose.    ? ?Denies fever.   ?

## 2021-11-30 NOTE — Discharge Instructions (Addendum)
Take Zyrtec or Benadryl as discussed for itching.   Take the prednisone as directed.  Follow up with your primary care provider or a dermatologist if your symptoms are not improving.   ? ?

## 2021-12-14 ENCOUNTER — Ambulatory Visit
Admission: EM | Admit: 2021-12-14 | Discharge: 2021-12-14 | Disposition: A | Discharge status: Home / Self Care | Payer: Medicaid Other | Attending: Emergency Medicine | Admitting: Emergency Medicine

## 2021-12-14 DIAGNOSIS — R21 Rash and other nonspecific skin eruption: Secondary | ICD-10-CM | POA: Diagnosis not present

## 2021-12-14 MED ORDER — HYDROXYZINE HCL 25 MG PO TABS: 25.0000 mg | ORAL_TABLET | Freq: Four times a day (QID) | ORAL | 0 refills | Status: DC

## 2021-12-14 NOTE — ED Triage Notes (Signed)
Patient presents to Urgent Care with complaints of continued rash located on her chest area since 05/14. Pt unable to follow-up with dermatology states no available appts. Pt states rash has worsened continues to spread. Finished her prednisone treatment which helped a little. Pt states she has noted that when anxious or under a lot of stress her rash worsens.   Denies fever.

## 2021-12-14 NOTE — ED Provider Notes (Signed)
[facility]    CSN: [id number] Arrival date & time: 12/14/21  1550      History   Chief Complaint Chief Complaint  Patient presents with   Rash    HPI Brittany Horn is a 26 y.o. female.  Patient presents with ongoing pruritic rash on her abdomen and chest.  The rash was improving but became worse over the past couple of days.  The itching has gotten worse since completed the prednisone.  No fever, sore throat, or other symptoms.  She feels the rash is worse when she is stressed.  Patient was seen here on 11/30/2021; diagnosed with rash; discussed that the rash was likely pityriasis rosea; treated with prednisone taper due to significant pruritus.  She was seen at [facility] urgent care on 11/28/2021; diagnosed with rash; treated with Diflucan and ketoconazole cream; the provider notes that she discussed with the patient that the rash could be pityriasis rosea versus fungal; verbal education was provided on both.    The history is provided by the patient and medical records.   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 No pertinent surgical history.  OB History     Gravida  3   Para  2   Term  2   Preterm      AB      Living  2      SAB      IAB      Ectopic      Multiple  0   Live Births  1            Home Medications    Prior to Admission medications   Medication Sig Start Date End Date Taking? Authorizing Provider  hydrOXYzine (ATARAX) 25 MG tablet Take 1 tablet (25 mg total) by mouth every 6 (six) hours. 12/14/21  Yes Sharion Balloon, NP  fluconazole (DIFLUCAN) 150 MG tablet Take 1 tab po q72h 11/28/21   Danton Clap, PA-C  levonorgestrel (MIRENA) 20 MCG/24HR IUD 1 each by Intrauterine route once.    [provider]  metroNIDAZOLE (FLAGYL)  500 MG tablet Take 1 tablet (500 mg total) by mouth 2 (two) times daily. Avoid alcohol while taking this medication and for 2 days after 10/14/21   Hazel Sams, PA-C    Family History Family History  Problem Relation Age of Onset   Other Mother 59       BARD1 gene mutation positive   Healthy Father    Uterine cancer Maternal Grandmother 14   Kidney cancer Paternal Grandmother    Lung cancer Paternal Grandmother    Breast cancer Maternal Aunt        42s   Ovarian cancer Maternal Aunt        58s   Brain cancer Paternal Aunt        61s   Breast cancer Paternal Aunt        28s   Breast cancer Paternal Aunt 67    Social History Social History   Tobacco Use   Smoking status: Never   Smokeless tobacco: Never  Vaping Use   Vaping Use: Never used  Substance Use Topics   Alcohol use: Yes    Comment: social   Drug use: No  Allergies   Patient has no known allergies.   Review of Systems Review of Systems  Constitutional:  Negative for chills and fever.  HENT:  Negative for sore throat.   Respiratory:  Negative for cough and shortness of breath.   Skin:  Positive for rash.  All other systems reviewed and are negative.   Physical Exam Triage Vital Signs ED Triage Vitals  Enc Vitals Group     BP      Pulse      Resp      Temp      Temp src      SpO2      Weight      Height      Head Circumference      Peak Flow      Pain Score      Pain Loc      Pain Edu?      Excl. in [place]?    No data found.  Updated Vital Signs BP 115/75   Pulse 74   Temp 97.9 F (36.6 C)   Resp 18   SpO2 97%   Visual Acuity Right Eye Distance:   Left Eye Distance:   Bilateral Distance:    Right Eye Near:   Left Eye Near:    Bilateral Near:     Physical Exam Vitals and nursing note reviewed.  Constitutional:      General: She is not in acute distress.    Appearance: Normal appearance. She is well-developed. She is not ill-appearing.  HENT:     Mouth/Throat:      Mouth: Mucous membranes are moist.  Cardiovascular:     Rate and Rhythm: Normal rate and regular rhythm.  Pulmonary:     Effort: Pulmonary effort is normal. No respiratory distress.  Musculoskeletal:     Cervical back: Neck supple.  Skin:    General: Skin is warm and dry.     Findings: Rash present.     Comments: Light pink annular rash on abdomen and chest.  No open wounds or drainage.  Neurological:     Mental Status: She is alert.  Psychiatric:        Mood and Affect: Mood normal.        Behavior: Behavior normal.     UC Treatments / Results  Labs (all labs ordered are listed, but only abnormal results are displayed) Labs Reviewed - No data to display  EKG   Radiology No results found.  Procedures Procedures (including critical care time)  Medications Ordered in UC Medications - No data to display  Initial Impression / Assessment and Plan / UC Course  I have reviewed the triage vital signs and the nursing notes.  Pertinent labs & imaging results that were available during my care of the patient were reviewed by me and considered in my medical decision making (see chart for details).    Rash.  The rash appears to have improved since patient's last visit based on appearance.  Again discussed with patient that if the rash is pityriasis rosea, it can last for several weeks.  She has an appointment scheduled with a dermatologist in 4 weeks; this was the soonest appointment she could get.  Treating itching along with hydroxyzine.  Discussed drowsiness with hydroxyzine and precautions given.  Instructed patient to follow-up with her PCP or the dermatologist at the soonest available opportunity.  She agrees to plan of care.  Final Clinical Impressions(s) / UC Diagnoses   Final diagnoses:  Rash     Discharge Instructions      Take the hydroxyzine as directed; do not drive, operate machinery, or drink alcohol with this medication as it may cause drowsiness.    Follow  up with your primary care provider or dermatologist.         ED Prescriptions     Medication Sig Dispense Auth. Provider   hydrOXYzine (ATARAX) 25 MG tablet Take 1 tablet (25 mg total) by mouth every 6 (six) hours. 12 tablet Sharion Balloon, NP      PDMP not reviewed this encounter.   Sharion Balloon, NP 12/14/21 [phone]

## 2021-12-14 NOTE — Discharge Instructions (Addendum)
Take the hydroxyzine as directed; do not drive, operate machinery, or drink alcohol with this medication as it may cause drowsiness.    Follow up with your primary care provider or dermatologist.

## 2021-12-17 ENCOUNTER — Other Ambulatory Visit: Payer: Self-pay

## 2021-12-17 ENCOUNTER — Ambulatory Visit
Admission: EM | Admit: 2021-12-17 | Discharge: 2021-12-17 | Disposition: A | Discharge status: Home / Self Care | Payer: Medicaid Other | Attending: Emergency Medicine | Admitting: Emergency Medicine

## 2021-12-17 ENCOUNTER — Encounter: Payer: Self-pay | Admitting: Emergency Medicine

## 2021-12-17 DIAGNOSIS — R21 Rash and other nonspecific skin eruption: Secondary | ICD-10-CM

## 2021-12-17 MED ORDER — PRAMOXINE HCL 1 % EX CREA: 1.0000 "application " | TOPICAL_CREAM | Freq: Three times a day (TID) | CUTANEOUS | 0 refills | Status: DC | PRN

## 2021-12-17 MED ORDER — ACYCLOVIR 400 MG PO TABS
400.0000 mg | ORAL_TABLET | Freq: Three times a day (TID) | ORAL | 0 refills | Status: AC
Start: 2021-12-17 — End: 2021-12-24

## 2021-12-17 MED ORDER — PREDNISONE 10 MG (21) PO TBPK: ORAL_TABLET | ORAL | 0 refills | Status: DC

## 2021-12-17 NOTE — ED Triage Notes (Signed)
Patient c/o itchy rash on her chest, abdomen, and back that started on 5/14.  Patient has been seen multiple times for this rash.  Patient finished her Prednisone.

## 2021-12-17 NOTE — Discharge Instructions (Addendum)
As we discussed, your rash does appear to be pityriasis rosea and this can take several weeks to resolve.  Take the prednisone taper according to the package instructions.  Be mindful that this medication will make you more sensitive to sunlight and more prone to sunburn.  Please apply sunscreen to your entire body, including underclothes and undergarments, to prevent sunburn.  Clothing provides a UV protection factor of approximately 4 and therefore does not offer protection against sunburn.  Take the acyclovir, 400 mg 3 times daily for 7 days, to help resolve the rash.  Apply the pramoxine hydrochloride cream 3 times daily to affected skin areas to help with the rash.  Keep your follow-up appoint with dermatology at the end of the month.

## 2021-12-17 NOTE — ED Provider Notes (Signed)
[facility]    CSN: [id number] Arrival date & time: 12/17/21  0800      History   Chief Complaint Chief Complaint  Patient presents with   Rash    HPI STAPHANY DITTON is a 26 y.o. female.   HPI  26 year old female here for evaluation of rash.  She was initially evaluated on 514 for a pruritic rash on her chest and abdomen.  She reports that a large patch, which she indicates is approximately size of a $0.50 piece, started the rash and then it spread.  She currently has a erythematous, macular rash on her abdomen, upper chest, bilateral flanks, back, and her face.  She reports that the rash appeared on both cheeks, her chin, neck, and forehead yesterday.  She denies any difficulty breathing, swelling of lips and tongue, or tightness in her throat.  She has been evaluated 3 times previously for this rash and she was treated with fluconazole and ketoconazole, which did not help, prednisone, which did help, and hydroxyzine for the itching.  She reports that she is in a wedding tomorrow that is outdoors in the spaghetti status and she is concerned about the rash and she wants it resolved.  She does have an appointment with dermatology at the end of the month.  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 No pertinent surgical history.  OB History     Gravida  3   Para  2   Term  2   Preterm      AB      Living  2      SAB      IAB      Ectopic      Multiple  0   Live Births  1            Home Medications    Prior to Admission medications   Medication Sig Start Date End Date Taking? Authorizing Provider  acyclovir (ZOVIRAX) 400 MG tablet Take 1 tablet (400 mg total) by mouth 3 (three) times daily for 7 days. 12/17/21 12/24/21 Yes Margarette Canada, NP  Pramoxine HCl  1 % CREA Apply 1 application. topically 3 (three) times daily as needed. Apply a thin layer to the affected skin area 3 times daily as needed for itching. 12/17/21  Yes Margarette Canada, NP  predniSONE (STERAPRED UNI-PAK 21 TAB) 10 MG (21) TBPK tablet Take 6 tablets on day 1, 5 tablets day 2, 4 tablets day 3, 3 tablets day 4, 2 tablets day 5, 1 tablet day 6 12/17/21  Yes Margarette Canada, NP  hydrOXYzine (ATARAX) 25 MG tablet Take 1 tablet (25 mg total) by mouth every 6 (six) hours. 12/14/21   Sharion Balloon, NP  levonorgestrel (MIRENA) 20 MCG/24HR IUD 1 each by Intrauterine route once.    [provider]    Family History Family History  Problem Relation Age of Onset   Other Mother 81       BARD1 gene mutation positive   Healthy Father    Uterine cancer Maternal Grandmother 64   Kidney cancer Paternal Grandmother    Lung cancer Paternal Grandmother    Breast cancer Maternal Aunt        60s   Ovarian cancer Maternal Aunt  87s   Brain cancer Paternal Aunt        77s   Breast cancer Paternal Aunt        1s   Breast cancer Paternal Aunt 67    Social History Social History   Tobacco Use   Smoking status: Never   Smokeless tobacco: Never  Vaping Use   Vaping Use: Never used  Substance Use Topics   Alcohol use: Yes    Comment: social   Drug use: No     Allergies   Patient has no known allergies.   Review of Systems Review of Systems  HENT:  Negative for facial swelling, sore throat and trouble swallowing.   Respiratory:  Negative for chest tightness and shortness of breath.   Skin:  Positive for rash.  Hematological: Negative.   Psychiatric/Behavioral: Negative.      Physical Exam Triage Vital Signs ED Triage Vitals  Enc Vitals Group     BP 12/17/21 0812 104/65     Pulse Rate 12/17/21 0812 70     Resp 12/17/21 0812 14     Temp 12/17/21 0812 98.2 F (36.8 C)     Temp Source 12/17/21 0812 Oral     SpO2 12/17/21 0812 97 %     Weight 12/17/21 0810 190 lb (86.2  kg)     Height 12/17/21 0810 '5\' 8"'  (1.727 m)     Head Circumference --      Peak Flow --      Pain Score 12/17/21 0810 0     Pain Loc --      Pain Edu? --      Excl. in [place]? --    No data found.  Updated Vital Signs BP 104/65 (BP Location: Left Arm)   Pulse 70   Temp 98.2 F (36.8 C) (Oral)   Resp 14   Ht '5\' 8"'  (1.727 m)   Wt 190 lb (86.2 kg)   SpO2 97%   BMI 28.89 kg/m   Visual Acuity Right Eye Distance:   Left Eye Distance:   Bilateral Distance:    Right Eye Near:   Left Eye Near:    Bilateral Near:     Physical Exam Vitals and nursing note reviewed.  Constitutional:      Appearance: Normal appearance. She is not ill-appearing.  HENT:     Head: Normocephalic and atraumatic.  Cardiovascular:     Rate and Rhythm: Normal rate and regular rhythm.     Pulses: Normal pulses.     Heart sounds: Normal heart sounds. No murmur heard.   No friction rub. No gallop.  Pulmonary:     Effort: Pulmonary effort is normal.     Breath sounds: Normal breath sounds. No stridor. No wheezing, rhonchi or rales.  Musculoskeletal:     Cervical back: Normal range of motion and neck supple. No tenderness.  Skin:    General: Skin is warm and dry.     Capillary Refill: Capillary refill takes less than 2 seconds.     Findings: Erythema and rash present.  Neurological:     General: No focal deficit present.     Mental Status: She is alert and oriented to person, place, and time.  Psychiatric:        Mood and Affect: Mood normal.        Behavior: Behavior normal.        Thought Content: Thought content normal.        Judgment: Judgment normal.  UC Treatments / Results  Labs (all labs ordered are listed, but only abnormal results are displayed) Labs Reviewed - No data to display  EKG   Radiology No results found.  Procedures Procedures (including critical care time)  Medications Ordered in UC Medications - No data to display  Initial Impression / Assessment and Plan /  UC Course  I have reviewed the triage vital signs and the nursing notes.  Pertinent labs & imaging results that were available during my care of the patient were reviewed by me and considered in my medical decision making (see chart for details).  Patient is a very pleasant, nontoxic-appearing 26 year old female here for reevaluation of a rash that has been present since 11/23/2021.  She is not having any difficulty breathing, swelling of lips or tongue, or throat tightness.  She denies any new personal hygiene products, laundry detergents, or clothing.  She states that she has stopped using a number of her cosmetics and other products without any improvement of her rash.  Given her description of what sounds like a herald patch, followed by this macular, mildly erythematous rash with a rough texture I believe the patient has pityriasis rosea.  She does report that prednisone helped improve the rash greatly and she has not had any prednisone in the last 10 days.  She states that she is taking hydroxyzine for itching but is unclear as to whether or not it is effective.  I will prescribe pramoxine hydrochloride 1% cream that she can use 3-4 times a day to help with itching and I will prescribe a second round of prednisone in a taper.  After reviewing [employer] clinical key I will also prescribe acyclovir 400 mg p.o. 3 times daily for 7 days.  I did caution the patient, since she will be outdoors at the wedding, that she needs to apply sunscreen to her entire body because the prednisone will make her more photosensitive.  This includes underneath her clothing as her clothing only provides a UV protection factor of approximately 4.  Patient verbalizes understanding of same.   Final Clinical Impressions(s) / UC Diagnoses   Final diagnoses:  Pityriasis rosea-like skin eruption     Discharge Instructions      As we discussed, your rash does appear to be pityriasis rosea and this can take several weeks to  resolve.  Take the prednisone taper according to the package instructions.  Be mindful that this medication will make you more sensitive to sunlight and more prone to sunburn.  Please apply sunscreen to your entire body, including underclothes and undergarments, to prevent sunburn.  Clothing provides a UV protection factor of approximately 4 and therefore does not offer protection against sunburn.  Take the acyclovir, 400 mg 3 times daily for 7 days, to help resolve the rash.  Apply the pramoxine hydrochloride cream 3 times daily to affected skin areas to help with the rash.  Keep your follow-up appoint with dermatology at the end of the month.     ED Prescriptions     Medication Sig Dispense Auth. Provider   predniSONE (STERAPRED UNI-PAK 21 TAB) 10 MG (21) TBPK tablet Take 6 tablets on day 1, 5 tablets day 2, 4 tablets day 3, 3 tablets day 4, 2 tablets day 5, 1 tablet day 6 21 tablet Margarette Canada, NP   Pramoxine HCl 1 % CREA Apply 1 application. topically 3 (three) times daily as needed. Apply a thin layer to the affected skin area 3 times daily as  needed for itching. 340 g Margarette Canada, NP   acyclovir (ZOVIRAX) 400 MG tablet Take 1 tablet (400 mg total) by mouth 3 (three) times daily for 7 days. 21 tablet Margarette Canada, NP      PDMP not reviewed this encounter.   Margarette Canada, NP 12/17/21 [phone]

## 2023-10-25 DIAGNOSIS — M25461 Effusion, right knee: Secondary | ICD-10-CM | POA: Diagnosis not present

## 2023-10-25 DIAGNOSIS — M069 Rheumatoid arthritis, unspecified: Secondary | ICD-10-CM | POA: Diagnosis not present

## 2023-12-18 ENCOUNTER — Encounter: Admitting: Obstetrics and Gynecology

## 2023-12-25 ENCOUNTER — Encounter: Payer: Self-pay | Admitting: Obstetrics and Gynecology

## 2024-01-21 ENCOUNTER — Emergency Department (HOSPITAL_COMMUNITY)

## 2024-01-21 ENCOUNTER — Other Ambulatory Visit: Payer: Self-pay

## 2024-01-21 ENCOUNTER — Encounter (HOSPITAL_COMMUNITY): Payer: Self-pay

## 2024-01-21 ENCOUNTER — Emergency Department (HOSPITAL_COMMUNITY)
Admission: EM | Admit: 2024-01-21 | Discharge: 2024-01-21 | Disposition: A | Discharge status: Home / Self Care | Attending: Emergency Medicine | Admitting: Emergency Medicine

## 2024-01-21 DIAGNOSIS — R0789 Other chest pain: Secondary | ICD-10-CM | POA: Diagnosis not present

## 2024-01-21 DIAGNOSIS — K8050 Calculus of bile duct without cholangitis or cholecystitis without obstruction: Secondary | ICD-10-CM | POA: Diagnosis not present

## 2024-01-21 DIAGNOSIS — R059 Cough, unspecified: Secondary | ICD-10-CM | POA: Diagnosis not present

## 2024-01-21 DIAGNOSIS — D72829 Elevated white blood cell count, unspecified: Secondary | ICD-10-CM | POA: Insufficient documentation

## 2024-01-21 DIAGNOSIS — R079 Chest pain, unspecified: Secondary | ICD-10-CM | POA: Diagnosis not present

## 2024-01-21 DIAGNOSIS — N12 Tubulo-interstitial nephritis, not specified as acute or chronic: Secondary | ICD-10-CM | POA: Diagnosis not present

## 2024-01-21 DIAGNOSIS — K8020 Calculus of gallbladder without cholecystitis without obstruction: Secondary | ICD-10-CM | POA: Diagnosis not present

## 2024-01-21 DIAGNOSIS — J9811 Atelectasis: Secondary | ICD-10-CM | POA: Diagnosis not present

## 2024-01-21 LAB — COMPREHENSIVE METABOLIC PANEL WITH GFR
ALT: 10 U/L (ref 0–44)
AST: 12 U/L — ABNORMAL LOW (ref 15–41)
Albumin: 3.6 g/dL (ref 3.5–5.0)
Alkaline Phosphatase: 47 U/L (ref 38–126)
Anion gap: 11 (ref 5–15)
BUN: 5 mg/dL — ABNORMAL LOW (ref 6–20)
CO2: 21 mmol/L — ABNORMAL LOW (ref 22–32)
Calcium: 8.7 mg/dL — ABNORMAL LOW (ref 8.9–10.3)
Chloride: 104 mmol/L (ref 98–111)
Creatinine, Ser: 0.86 mg/dL (ref 0.44–1.00)
GFR, Estimated: >60 mL/min (ref >60)
Glucose, Bld: 103 mg/dL — ABNORMAL HIGH (ref 70–99)
Potassium: 3.5 mmol/L (ref 3.5–5.1)
Sodium: 136 mmol/L (ref 135–145)
Total Bilirubin: 1.2 mg/dL (ref 0.0–1.2)
Total Protein: 6.6 g/dL (ref 6.5–8.1)

## 2024-01-21 LAB — URINALYSIS, W/ REFLEX TO CULTURE (INFECTION SUSPECTED)
Bilirubin Urine: NEGATIVE
Glucose, UA: NEGATIVE mg/dL
Ketones, ur: 20 mg/dL — AB
Nitrite: POSITIVE — AB
Protein, ur: NEGATIVE mg/dL
Specific Gravity, Urine: 1.006 (ref 1.005–1.030)
WBC, UA: >50 WBC/hpf (ref 0–5)
pH: 8 (ref 5.0–8.0)

## 2024-01-21 LAB — CBC WITH DIFFERENTIAL/PLATELET
Abs Immature Granulocytes: 0.04 K/uL (ref 0.00–0.07)
Basophils Absolute: 0 K/uL (ref 0.0–0.1)
Basophils Relative: 0 %
Eosinophils Absolute: 0 K/uL (ref 0.0–0.5)
Eosinophils Relative: 0 %
HCT: 44 % (ref 36.0–46.0)
Hemoglobin: 14.2 g/dL (ref 12.0–15.0)
Immature Granulocytes: 0 %
Lymphocytes Relative: 13 %
Lymphs Abs: 1.4 K/uL (ref 0.7–4.0)
MCH: 29 pg (ref 26.0–34.0)
MCHC: 32.3 g/dL (ref 30.0–36.0)
MCV: 90 fL (ref 80.0–100.0)
Monocytes Absolute: 0.7 K/uL (ref 0.1–1.0)
Monocytes Relative: 7 %
Neutro Abs: 8.7 K/uL — ABNORMAL HIGH (ref 1.7–7.7)
Neutrophils Relative %: 80 %
Platelets: 214 K/uL (ref 150–400)
RBC: 4.89 MIL/uL (ref 3.87–5.11)
RDW: 13.1 % (ref 11.5–15.5)
WBC: 10.9 K/uL — ABNORMAL HIGH (ref 4.0–10.5)
nRBC: 0 % (ref 0.0–0.2)

## 2024-01-21 LAB — TROPONIN I (HIGH SENSITIVITY): Troponin I (High Sensitivity): <2 ng/L (ref <18)

## 2024-01-21 LAB — D-DIMER, QUANTITATIVE: D-Dimer, Quant: 0.56 ug{FEU}/mL — ABNORMAL HIGH (ref 0.00–0.50)

## 2024-01-21 LAB — HCG, SERUM, QUALITATIVE: Preg, Serum: NEGATIVE

## 2024-01-21 MED ORDER — IOHEXOL 350 MG/ML SOLN
100.0000 mL | Freq: Once | INTRAVENOUS | Status: AC | PRN
  Administered 2024-01-21: 65 mL via INTRAVENOUS

## 2024-01-21 MED ORDER — SODIUM CHLORIDE 0.9 % IV BOLUS
1000.0000 mL | Freq: Once | INTRAVENOUS | Status: AC
  Administered 2024-01-21: 1000 mL via INTRAVENOUS

## 2024-01-21 MED ORDER — SODIUM CHLORIDE 0.9 % IV SOLN
1.0000 g | Freq: Once | INTRAVENOUS | Status: AC
  Administered 2024-01-21: 1 g via INTRAVENOUS
  Filled 2024-01-21: qty 10

## 2024-01-21 MED ORDER — CEFTRIAXONE SODIUM 1 G IJ SOLR
500.0000 mg | Freq: Once | INTRAMUSCULAR | Status: DC
  Filled 2024-01-21: qty 10

## 2024-01-21 MED ORDER — LIDOCAINE HCL 1 % IJ SOLN
INTRAMUSCULAR | Status: AC
  Filled 2024-01-21: qty 20

## 2024-01-21 MED ORDER — MORPHINE SULFATE (PF) 4 MG/ML IV SOLN
4.0000 mg | Freq: Once | INTRAVENOUS | Status: AC
  Administered 2024-01-21: 4 mg via INTRAVENOUS
  Filled 2024-01-21: qty 1

## 2024-01-21 MED ORDER — KETOROLAC TROMETHAMINE 15 MG/ML IJ SOLN
10.0000 mg | Freq: Once | INTRAMUSCULAR | Status: AC
  Administered 2024-01-21: 10 mg via INTRAVENOUS
  Filled 2024-01-21: qty 1

## 2024-01-21 MED ORDER — HYDROXYZINE HCL 25 MG PO TABS
25.0000 mg | ORAL_TABLET | Freq: Once | ORAL | Status: AC
Start: 2024-01-21 — End: 2024-01-21
  Administered 2024-01-21: 25 mg via ORAL
  Filled 2024-01-21: qty 1

## 2024-01-21 MED ORDER — CEFPODOXIME PROXETIL 200 MG PO TABS: 200.0000 mg | ORAL_TABLET | Freq: Two times a day (BID) | ORAL | 0 refills | Status: AC

## 2024-01-21 NOTE — Discharge Instructions (Addendum)
 While you were in the emergency room, you had many tests done.  There does not appear to be any issues with your heart or your lungs.  You do have some gallstones.  I have included the telephone number for a general surgeon.  You can call them to make an appointment to discuss testing on your gallbladder.  You also had an infection in your kidney called pyelonephritis.  This is what happens when a urinary tract infection reaches your kidney.  You received a dose of an antibiotic here in the ED.  I have sent a prescription for antibiotics to your pharmacy.  You can take cefpodoxime  once in the morning once in the evening for the next 7 days.  Return to the emergency room if you develop increasing pain, fever, inability to eat or drink.

## 2024-01-21 NOTE — ED Notes (Signed)
 Dc instructions and reviewed with pt no questions or concerns. Will follow up with general surgery.

## 2024-01-21 NOTE — ED Triage Notes (Signed)
 Pt complaining of chest pain. States that it happens when her daughter acts out.

## 2024-01-21 NOTE — ED Notes (Signed)
 Patient transported to CT

## 2024-01-21 NOTE — ED Provider Notes (Signed)
 [place] EMERGENCY DEPARTMENT AT [facility] Provider Note   CSN: [id number] Arrival date & time: 01/21/24  1424     Patient presents with: Chest Pain   Brittany Horn is a 28 y.o. female.   This is a 28 year old female here today with chest pain.  She says that this occurs when her daughter acts out.  Daughter was behaving aggressively towards siblings today, which has happened previously.  No shortness of breath, had done fine prior to stressor.   Chest Pain      Prior to Admission medications   Medication Sig Start Date End Date Taking? Authorizing Provider  cefpodoxime  (VANTIN ) 200 MG tablet Take 1 tablet (200 mg total) by mouth 2 (two) times daily for 7 days. 01/21/24 01/28/24 Yes Mannie Pac T, DO  hydrOXYzine  (ATARAX ) 25 MG tablet Take 1 tablet (25 mg total) by mouth every 6 (six) hours. 12/14/21   Corlis Burnard DEL, NP  levonorgestrel (MIRENA) 20 MCG/24HR IUD 1 each by Intrauterine route once.    [provider]  Pramoxine HCl 1 % CREA Apply 1 application. topically 3 (three) times daily as needed. Apply a thin layer to the affected skin area 3 times daily as needed for itching. 12/17/21   Bernardino Ditch, NP  predniSONE  (STERAPRED UNI-PAK 21 TAB) 10 MG (21) TBPK tablet Take 6 tablets on day 1, 5 tablets day 2, 4 tablets day 3, 3 tablets day 4, 2 tablets day 5, 1 tablet day 6 12/17/21   Bernardino Ditch, NP    Allergies: Patient has no known allergies.    Review of Systems  Cardiovascular:  Positive for chest pain.    Updated Vital Signs BP 95/60   Pulse 98   Temp 98.4 F (36.9 C) (Oral)   Resp 13   Ht 5' 8 (1.727 m)   Wt 86 kg   SpO2 98%   BMI 28.83 kg/m   Physical Exam Vitals and nursing note reviewed.  Cardiovascular:     Rate and Rhythm: Tachycardia present.     Heart sounds: Normal heart sounds.  Pulmonary:     Breath sounds: Normal breath sounds.  Chest:     Chest wall: No mass or tenderness.  Musculoskeletal:        General: Normal  range of motion.  Neurological:     Mental Status: She is alert.     (all labs ordered are listed, but only abnormal results are displayed) Labs Reviewed  CBC WITH DIFFERENTIAL/PLATELET - Abnormal; Notable for the following components:      Result Value   WBC 10.9 (*)    Neutro Abs 8.7 (*)    All other components within normal limits  COMPREHENSIVE METABOLIC PANEL WITH GFR - Abnormal; Notable for the following components:   CO2 21 (*)    Glucose, Bld 103 (*)    BUN 5 (*)    Calcium 8.7 (*)    AST 12 (*)    All other components within normal limits  D-DIMER, QUANTITATIVE - Abnormal; Notable for the following components:   D-Dimer, Quant 0.56 (*)    All other components within normal limits  URINALYSIS, W/ REFLEX TO CULTURE (INFECTION SUSPECTED) - Abnormal; Notable for the following components:   APPearance HAZY (*)    Hgb urine dipstick SMALL (*)    Ketones, ur 20 (*)    Nitrite POSITIVE (*)    Leukocytes,Ua LARGE (*)    Bacteria, UA RARE (*)    All other components  within normal limits  URINE CULTURE  HCG, SERUM, QUALITATIVE  TROPONIN I (HIGH SENSITIVITY)  TROPONIN I (HIGH SENSITIVITY)    EKG: None  Radiology: CT Angio Chest PE W and/or Wo Contrast Result Date: 01/21/2024 CLINICAL DATA:  Pulmonary embolism (PE) suspected, high prob. Cough. EXAM: CT ANGIOGRAPHY CHEST WITH CONTRAST TECHNIQUE: Multidetector CT imaging of the chest was performed using the standard protocol during bolus administration of intravenous contrast. Multiplanar CT image reconstructions and MIPs were obtained to evaluate the vascular anatomy. RADIATION DOSE REDUCTION: This exam was performed according to the departmental dose-optimization program which includes automated exposure control, adjustment of the mA and/or kV according to patient size and/or use of iterative reconstruction technique. CONTRAST:  65mL OMNIPAQUE  IOHEXOL  350 MG/ML SOLN COMPARISON:  Chest x-ray today. FINDINGS: Cardiovascular: Heart  is normal size. Aorta is normal caliber. No filling defects in the pulmonary arteries to suggest pulmonary emboli. Mediastinum/Nodes: No mediastinal, hilar, or axillary adenopathy. Trachea and esophagus are unremarkable. Thyroid  unremarkable. Lungs/Pleura: Minimal dependent atelectasis in the lower lobes. No confluent opacities or effusions. Upper Abdomen: Area of decreased perfusion in the upper pole of the left kidney. Pyelonephritis could have this appearance. Recommend clinical correlation. Musculoskeletal: Chest wall soft tissues are unremarkable. No acute bony abnormality. Review of the MIP images confirms the above findings. IMPRESSION: No evidence of pulmonary embolus. No acute cardiopulmonary disease. Area of subtle decreased perfusion in the upper pole right kidney, question pyelonephritis. Recommend clinical correlation. Electronically Signed   By: Franky Crease M.D.   On: 01/21/2024 19:49   DG Chest 1 View Result Date: 01/21/2024 CLINICAL DATA:  cough EXAM: CHEST  1 VIEW COMPARISON:  None Available. FINDINGS: The heart and mediastinal contours are within normal limits. No focal consolidation. No pulmonary edema. No pleural effusion. No pneumothorax. No acute osseous abnormality. IMPRESSION: No active disease. Electronically Signed   By: Morgane  Naveau M.D.   On: 01/21/2024 17:48   US  Abdomen Limited RUQ (LIVER/GB) Result Date: 01/21/2024 CLINICAL DATA:  Chest pain per epic notes EXAM: ULTRASOUND ABDOMEN LIMITED RIGHT UPPER QUADRANT COMPARISON:  None Available. FINDINGS: Gallbladder: Positive for gallstone. Normal wall thickness. Negative sonographic Murphy. Common bile duct: Diameter: 4 mm Liver: No focal lesion identified. Within normal limits in parenchymal echogenicity. Portal vein is patent on color Doppler imaging with normal direction of blood flow towards the liver. Other: None. IMPRESSION: Cholelithiasis without sonographic evidence for acute cholecystitis. Electronically Signed   By: Luke Bun M.D.   On: 01/21/2024 16:34     Procedures   Medications Ordered in the ED  cefTRIAXone  (ROCEPHIN ) 1 g in sodium chloride  0.9 % 100 mL IVPB (1 g Intravenous New Bag/Given 01/21/24 2058)  cefTRIAXone  (ROCEPHIN ) injection 500 mg (has no administration in time range)  hydrOXYzine  (ATARAX ) tablet 25 mg (25 mg Oral Given 01/21/24 1631)  ketorolac  (TORADOL ) 15 MG/ML injection 10 mg (10 mg Intravenous Given 01/21/24 1755)  sodium chloride  0.9 % bolus 1,000 mL (0 mLs Intravenous Stopped 01/21/24 2047)  morphine  (PF) 4 MG/ML injection 4 mg (4 mg Intravenous Given 01/21/24 1755)  iohexol  (OMNIPAQUE ) 350 MG/ML injection 100 mL (65 mLs Intravenous Contrast Given 01/21/24 1935)                                    Medical Decision Making 28 year old female here today with chest pain in the setting of an acute stressor.  Differential diagnoses include anxiety, less likely  PE, less likely pneumonia, less likely pneumothorax, less likely ACS.  Plan-EKG, basic blood work ordered.  Patient's reassuring physical exam.  Here today with daughter who was acting at home, during the patient began to experience pain in the chest.  I provided her with some Atarax .  Do not believe patient requires troponin testing.  Symptoms are consistent with D-dimer despite elevated heart rate.  Reassessment 9:10 PM-patient's workup is to have an adventure here.  Her D-dimer ended up being elevated, which led to CTA, showing possible early pyelonephritis.  Patient not systemically ill, had not endorsed any urinary symptoms.  Able to tolerate p.o.  Does have gallstones as well, no evidence of cholecystitis.  Treated the patient with Rocephin  here in the ED.  Will send prescription for antibiotics to her pharmacy.  Will provide outpatient follow-up for gallstones.  Return precaution discussed with patient at bedside.  She is agreeable with this plan.  Reassessment 5:30 PM-patient's early development of cough was in the room with her  going over her ultrasound imaging.  Based on this, will obtain a D-dimer.  Chest x-ray ordered.  Patient's ultrasound imaging shows gallstones, likely biliary colic, no evidence of infection.  Toradol  ordered.  Have ordered some morphine , she is arranging for someone else, and pick her up.  Patient does have some right upper quadrant tenderness.  Will obtain ultrasound.  Amount and/or Complexity of Data Reviewed Labs: ordered. Radiology: ordered.  Risk Prescription drug management.        Final diagnoses:  Biliary colic  Pyelonephritis    ED Discharge Orders          Ordered    cefpodoxime  (VANTIN ) 200 MG tablet  2 times daily        01/21/24 2120               Mannie Pac T, DO 01/21/24 2122

## 2024-01-24 DIAGNOSIS — N12 Tubulo-interstitial nephritis, not specified as acute or chronic: Secondary | ICD-10-CM | POA: Diagnosis not present

## 2024-01-24 DIAGNOSIS — K8020 Calculus of gallbladder without cholecystitis without obstruction: Secondary | ICD-10-CM | POA: Diagnosis not present

## 2024-02-20 ENCOUNTER — Telehealth: Payer: Self-pay | Admitting: Emergency Medicine

## 2024-02-20 ENCOUNTER — Ambulatory Visit: Admission: EM | Admit: 2024-02-20 | Discharge: 2024-02-20 | Disposition: A | Discharge status: Home / Self Care

## 2024-02-20 DIAGNOSIS — F410 Panic disorder [episodic paroxysmal anxiety] without agoraphobia: Secondary | ICD-10-CM

## 2024-02-20 DIAGNOSIS — F419 Anxiety disorder, unspecified: Secondary | ICD-10-CM

## 2024-02-20 DIAGNOSIS — F411 Generalized anxiety disorder: Secondary | ICD-10-CM | POA: Diagnosis not present

## 2024-02-20 MED ORDER — BUSPIRONE HCL 7.5 MG PO TABS: 7.5000 mg | ORAL_TABLET | Freq: Two times a day (BID) | ORAL | 1 refills | Status: DC

## 2024-02-20 NOTE — ED Provider Notes (Signed)
 [facility]    CSN: [id number] Arrival date & time: 02/20/24  1122      History   Chief Complaint Chief Complaint  Patient presents with   Anxiety    HPI Brittany Horn is a 28 y.o. Horn.   HPI  28 year old Horn with past medical history significant for dyspareunia, PID, and arthritis presents for evaluation of anxiety and panic attacks.  She reports that she has been battling anxiety for over a year that has worsened over the last month.  Last night she had a panic attack that involved hyperventilating, crying, and vomiting.  The panic attack was precipitated by the fact that she had to call 85 on her 10-year-old daughter who throws abuse of tantrums.  Her daughter is followed by behavioral health and is on medication.  The daughter's father, not the patient's husband, is also complicating issues.  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 No pertinent surgical history.  OB History     Gravida  3   Para  2   Term  2   Preterm      AB      Living  2      SAB      IAB      Ectopic      Multiple  0   Live Births  1            Home Medications    Prior to Admission medications   Medication Sig Start Date End Date Taking? Authorizing Provider  levonorgestrel (MIRENA) 20 MCG/24HR IUD 1 each by Intrauterine route once.   Yes [provider]  hydrOXYzine  (ATARAX ) 25 MG tablet Take 1 tablet (25 mg total) by mouth every 6 (six) hours. 12/14/21   Corlis Burnard DEL, NP  Pramoxine HCl 1 % CREA Apply 1 application. topically 3 (three) times daily as needed. Apply a thin layer to the affected skin area 3 times daily as needed for itching. 12/17/21   Bernardino Ditch, NP  predniSONE  (STERAPRED UNI-PAK 21 TAB) 10 MG (21) TBPK tablet Take 6 tablets on day 1, 5  tablets day 2, 4 tablets day 3, 3 tablets day 4, 2 tablets day 5, 1 tablet day 6 12/17/21   Bernardino Ditch, NP    Family History Family History  Problem Relation Age of Onset   Other Mother 48       BARD1 gene mutation positive   Healthy Father    Uterine cancer Maternal Grandmother 11   Kidney cancer Paternal Grandmother    Lung cancer Paternal Grandmother    Breast cancer Maternal Aunt        60s   Ovarian cancer Maternal Aunt        21s   Brain cancer Paternal Aunt        72s   Breast cancer Paternal Aunt        17s   Breast cancer Paternal Aunt 57    Social History Social History   Tobacco Use   Smoking status: Never   Smokeless tobacco: Never  Vaping Use   Vaping status: Never Used  Substance Use Topics   Alcohol use: Not Currently    Comment: social   Drug use: No     Allergies  Patient has no known allergies.   Review of Systems Review of Systems  Gastrointestinal:  Positive for vomiting.  Psychiatric/Behavioral:  Positive for agitation. Negative for self-injury and suicidal ideas. The patient is nervous/anxious.      Physical Exam Triage Vital Signs ED Triage Vitals  Encounter Vitals Group     BP      Girls Systolic BP Percentile      Girls Diastolic BP Percentile      Boys Systolic BP Percentile      Boys Diastolic BP Percentile      Pulse      Resp      Temp      Temp src      SpO2      Weight      Height      Head Circumference      Peak Flow      Pain Score      Pain Loc      Pain Education      Exclude from Growth Chart    No data found.  Updated Vital Signs BP (!) 126/90 (BP Location: Left Arm)   Pulse 86   Temp 99.2 F (37.3 C) (Oral)   Ht 5' 8 (1.727 m)   Wt 180 lb (81.6 kg)   LMP 02/06/2024   SpO2 98%   BMI 27.37 kg/m   Visual Acuity Right Eye Distance:   Left Eye Distance:   Bilateral Distance:    Right Eye Near:   Left Eye Near:    Bilateral Near:     Physical Exam Vitals and nursing note reviewed.   Constitutional:      Appearance: Normal appearance. She is not ill-appearing.  HENT:     Head: Normocephalic and atraumatic.  Neurological:     Mental Status: She is alert.  Psychiatric:        Behavior: Behavior normal.        Thought Content: Thought content normal.        Judgment: Judgment normal.      UC Treatments / Results  Labs (all labs ordered are listed, but only abnormal results are displayed) Labs Reviewed - No data to display  EKG   Radiology No results found.  Procedures Procedures (including critical care time)  Medications Ordered in UC Medications - No data to display  Initial Impression / Assessment and Plan / UC Course  I have reviewed the triage vital signs and the nursing notes.  Pertinent labs & imaging results that were available during my care of the patient were reviewed by me and considered in my medical decision making (see chart for details).   Patient is a pleasant, nontoxic appearing Brittany Horn presenting for evaluation for anxiety.  She is very tearful in the exam room relating the history of mental illness that she is dealing with with her 74-year-old daughter.  She had a panic attack last night precipitated by having to call 911 due to the violent nature of her daughter's tantrum.  The daughter's father and his new wife also complicate matters.  She has a supportive boyfriend who is in the picture.  The patient reports that she has been very withdrawn and socially isolating due to the anxiety.  She has come to terms with the fact that she needs therapy as well as medication to help with her symptoms.  I have advised her that we do not manage anxiety here but [facility] [place]  They have a 24/7 behavioral health urgent care.  I have given her the address and phone number.   Final Clinical Impressions(s) / UC Diagnoses   Final diagnoses:  Panic attack  Anxiety     Discharge Instructions       Please go to the behavioral health urgent care and [facility] in [place].  Their address is [address] [place], [place].  [address].  Their phone number is [phone].     ED Prescriptions   None    PDMP not reviewed this encounter.   Bernardino Ditch, NP 02/20/24 1212

## 2024-02-20 NOTE — Telephone Encounter (Signed)
 Patient went to behavioral therapy care and had an assessment performed.  They informed her that we will be finalized within the week but would take up to 2 weeks for her to get into see a physician and be prescribed medication.  Even though the symptoms I will start the patient on buspirone  7.5 mg twice daily.  Advised her that she can discuss continuing this medication or do a dosage adjustment in 2 weeks when she sees the psychiatrist.

## 2024-02-20 NOTE — Discharge Instructions (Addendum)
 Please go to the behavioral health urgent care and [facility] in [place].  Their address is [address], [place], [place].  [address].  Their phone number is [phone].

## 2024-02-20 NOTE — ED Triage Notes (Signed)
 Pt c/o anxiety x36month  Pt states that she has been battling her anxiety for 1 year but it has worsened in the last month  Pt denies having an anxiety or panic attack until last night  Pt believes her anxiety is due to family issues, lack of support, and parenting

## 2024-02-29 ENCOUNTER — Ambulatory Visit (HOSPITAL_COMMUNITY): Admission: RE | Admit: 2024-02-29 | Source: Home / Self Care | Admitting: Surgery

## 2024-02-29 ENCOUNTER — Encounter (HOSPITAL_COMMUNITY): Admission: RE | Payer: Self-pay | Source: Home / Self Care

## 2024-02-29 SURGERY — LAPAROSCOPIC CHOLECYSTECTOMY
Anesthesia: General

## 2024-04-08 ENCOUNTER — Ambulatory Visit
Admission: EM | Admit: 2024-04-08 | Discharge: 2024-04-08 | Disposition: A | Discharge status: Home / Self Care | Attending: Physician Assistant | Admitting: Physician Assistant

## 2024-04-08 DIAGNOSIS — J029 Acute pharyngitis, unspecified: Secondary | ICD-10-CM | POA: Insufficient documentation

## 2024-04-08 LAB — GROUP A STREP BY PCR: Group A Strep by PCR: NOT DETECTED

## 2024-04-08 MED ORDER — LIDOCAINE VISCOUS HCL 2 % MT SOLN: 15.0000 mL | OROMUCOSAL | 0 refills | Status: DC | PRN

## 2024-04-08 NOTE — ED Triage Notes (Signed)
 Pt c/o sore throat x1 day. No OTC meds.

## 2024-04-08 NOTE — ED Provider Notes (Signed)
 [facility]    CSN: [id number] Arrival date & time: 04/08/24  0804      History   Chief Complaint Chief Complaint  Patient presents with   Sore Throat    HPI Brittany Horn is a 28 y.o. female presenting for sore throat, subjective fever, and fatigue since last night. Patient denies body aches, headaches, ear pain, sinus pain, chest pain, wheezing, shortness of breath, abdominal pain, vomiting or diarrhea.   Patient reports long history of strep throat. Patient has not been taking over-the-counter meds. No other complaints.  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 No pertinent surgical history.  OB History     Gravida  3   Para  2   Term  2   Preterm      AB      Living  2      SAB      IAB      Ectopic      Multiple  0   Live Births  1            Home Medications    Prior to Admission medications   Medication Sig Start Date End Date Taking? Authorizing Provider  busPIRone  (BUSPAR ) 7.5 MG tablet Take 1 tablet (7.5 mg total) by mouth 2 (two) times daily. 02/20/24   Bernardino Ditch, NP  levonorgestrel (MIRENA) 20 MCG/24HR IUD 1 each by Intrauterine route once.    [provider]    Family History Family History  Problem Relation Age of Onset   Other Mother 40       BARD1 gene mutation positive   Healthy Father    Uterine cancer Maternal Grandmother 51   Kidney cancer Paternal Grandmother    Lung cancer Paternal Grandmother    Breast cancer Maternal Aunt        16s   Ovarian cancer Maternal Aunt        34s   Brain cancer Paternal Aunt        5s   Breast cancer Paternal Aunt        96s   Breast cancer Paternal Aunt 68    Social History Social History   Tobacco Use   Smoking status: Never   Smokeless tobacco: Never   Vaping Use   Vaping status: Never Used  Substance Use Topics   Alcohol use: Not Currently    Comment: social   Drug use: No     Allergies   Patient has no known allergies.   Review of Systems Review of Systems  Constitutional:  Positive for fatigue and fever (subjective). Negative for chills and diaphoresis.  HENT:  Positive for sore throat. Negative for congestion, ear pain, rhinorrhea, sinus pressure and sinus pain.   Respiratory:  Negative for cough and shortness of breath.   Cardiovascular:  Negative for chest pain.  Gastrointestinal:  Negative for abdominal pain, nausea and vomiting.  Musculoskeletal:  Negative for arthralgias and myalgias.  Skin:  Negative for rash.  Neurological:  Negative for weakness and headaches.  Hematological:  Negative for adenopathy.     Physical Exam Triage Vital Signs ED Triage Vitals  Encounter Vitals Group     BP --      Girls Systolic BP Percentile --  Girls Diastolic BP Percentile --      Boys Systolic BP Percentile --      Boys Diastolic BP Percentile --      Pulse --      Resp 04/08/24 0813 16     Temp --      Temp Source 04/08/24 0813 Oral     SpO2 --      Weight 04/08/24 0812 185 lb (83.9 kg)     Height 04/08/24 0812 5' 8 (1.727 m)     Head Circumference --      Peak Flow --      Pain Score 04/08/24 0823 10     Pain Loc --      Pain Education --      Exclude from Growth Chart --    No data found.  Updated Vital Signs BP 108/70 (BP Location: Right Arm)   Pulse 70   Temp 98.4 F (36.9 C) (Oral)   Resp 16   Ht 5' 8 (1.727 m)   Wt 185 lb (83.9 kg)   SpO2 97%   BMI 28.13 kg/m     Physical Exam Vitals and nursing note reviewed.  Constitutional:      General: She is not in acute distress.    Appearance: Normal appearance. She is not ill-appearing or toxic-appearing.  HENT:     Head: Normocephalic and atraumatic.     Nose: Nose normal.     Mouth/Throat:     Mouth: Mucous membranes are moist.      Pharynx: Oropharynx is clear. Posterior oropharyngeal erythema present.  Eyes:     General: No scleral icterus.       Right eye: No discharge.        Left eye: No discharge.     Conjunctiva/sclera: Conjunctivae normal.  Cardiovascular:     Rate and Rhythm: Normal rate and regular rhythm.     Heart sounds: Normal heart sounds.  Pulmonary:     Effort: Pulmonary effort is normal. No respiratory distress.     Breath sounds: Normal breath sounds.  Musculoskeletal:     Cervical back: Neck supple.  Lymphadenopathy:     Cervical: Cervical adenopathy present.  Skin:    General: Skin is dry.  Neurological:     General: No focal deficit present.     Mental Status: She is alert. Mental status is at baseline.     Motor: No weakness.     Gait: Gait normal.  Psychiatric:        Mood and Affect: Mood normal.        Behavior: Behavior normal.      UC Treatments / Results  Labs (all labs ordered are listed, but only abnormal results are displayed) Labs Reviewed  GROUP A STREP BY PCR    EKG   Radiology No results found.  Procedures Procedures (including critical care time)  Medications Ordered in UC Medications - No data to display  Initial Impression / Assessment and Plan / UC Course  I have reviewed the triage vital signs and the nursing notes.  Pertinent labs & imaging results that were available during my care of the patient were reviewed by me and considered in my medical decision making (see chart for details).   28 year old female presents for sore throat, fatigue and subjective fever since last night.  Reports history of strep a couple times a year since she was 28 years old.  No cough or congestion.  Vitals are stable and normal.  Patient overall well-appearing.  No acute distress.  On exam has erythema posterior pharynx.  Chest clear.  Heart regular rate and rhythm.  PCR strep test obtained.  Negative.  Discussed results with patient and offered COVID test.  Explained  viral symptoms typically start with a sore throat and so other symptoms may develop.  Typical course of most viral illness reviewed with patient.  Patient declines COVID testing stating that it does not feel like COVID.  She declines any prescription medications for pain relief and states that she has a bag of stuff at home she can take.  Reviewed return precautions.   Final Clinical Impressions(s) / UC Diagnoses   Final diagnoses:  Viral pharyngitis   Discharge Instructions   None    ED Prescriptions   None    PDMP not reviewed this encounter.   Arvis Jolan NOVAK, PA-C 04/08/24 [phone]

## 2024-06-03 ENCOUNTER — Ambulatory Visit
Admission: EM | Admit: 2024-06-03 | Discharge: 2024-06-03 | Disposition: A | Discharge status: Home / Self Care | Attending: Emergency Medicine | Admitting: Emergency Medicine

## 2024-06-03 DIAGNOSIS — N390 Urinary tract infection, site not specified: Secondary | ICD-10-CM | POA: Insufficient documentation

## 2024-06-03 DIAGNOSIS — R319 Hematuria, unspecified: Secondary | ICD-10-CM | POA: Diagnosis not present

## 2024-06-03 LAB — POCT URINE DIPSTICK
Bilirubin, UA: NEGATIVE
Glucose, UA: NEGATIVE mg/dL
Ketones, POC UA: NEGATIVE mg/dL
Nitrite, UA: POSITIVE — AB
Protein Ur, POC: NEGATIVE mg/dL
Spec Grav, UA: 1.025 (ref 1.010–1.025)
Urobilinogen, UA: 1 U/dL
pH, UA: 5.5 (ref 5.0–8.0)

## 2024-06-03 MED ORDER — NITROFURANTOIN MONOHYD MACRO 100 MG PO CAPS: 100.0000 mg | ORAL_CAPSULE | Freq: Two times a day (BID) | ORAL | 0 refills | Status: AC

## 2024-06-03 NOTE — ED Provider Notes (Signed)
 HPI  SUBJECTIVE:  Brittany Horn is a 28 y.o. female who presents with foul-smelling urine since July but has gotten worse over the past month.  She was diagnosed with pyelonephritis on the ER visit on 7/25, was given Rocephin , but never picked up the cefpodoxime .  She reports urinary frequency, cloudy urine.  No dysuria, urgency, hematuria, vaginal odor, bleeding, discharge, rash or itching.  No nausea,, fevers, abdominal, back, pelvic pain.  She is in a long-term monogamous relationship with a female, who is asymptomatic.  STDs are not a concern today.  No antibiotics in the past month.  No antipyretic in the past 6 hours.  She tried cranberry juice and Azo without improvement in her symptoms.  No aggravating or alleviating factors.  LMP: Now.  Denies the possibility of being pregnant.  PCP: None.  She has a past medical history of BV, PID, UTI, pyelonephritis.    She was seen in the emergency department in July, found to have CT findings suggestive of early pyelonephritis and was sent home on cefpodoxime .  Last positive urine culture was in 2019 which showed E. coli susceptible to cephalosporins, fluoroquinolones, Macrobid, tetracyclines and Bactrim.  Past Medical History:  Diagnosis Date   Arthritis    PID (acute pelvic inflammatory disease)     History reviewed. No pertinent surgical history.  Family History  Problem Relation Age of Onset   Other Mother 66       BARD1 gene mutation positive   Healthy Father    Uterine cancer Maternal Grandmother 40   Kidney cancer Paternal Grandmother    Lung cancer Paternal Grandmother    Breast cancer Maternal Aunt        66s   Ovarian cancer Maternal Aunt        63s   Brain cancer Paternal Aunt        41s   Breast cancer Paternal Aunt        23s   Breast cancer Paternal Aunt 41    Social History   Tobacco Use   Smoking status: Never   Smokeless tobacco: Never  Vaping Use   Vaping status: Never Used  Substance Use Topics   Alcohol  use: Not Currently    Comment: social   Drug use: No    No current facility-administered medications for this encounter.  Current Outpatient Medications:    nitrofurantoin, macrocrystal-monohydrate, (MACROBID) 100 MG capsule, Take 1 capsule (100 mg total) by mouth 2 (two) times daily for 5 days., Disp: 10 capsule, Rfl: 0   busPIRone  (BUSPAR ) 7.5 MG tablet, Take 1 tablet (7.5 mg total) by mouth 2 (two) times daily., Disp: 60 tablet, Rfl: 1   levonorgestrel (MIRENA) 20 MCG/24HR IUD, 1 each by Intrauterine route once., Disp: , Rfl:   No Known Allergies   ROS  As noted in HPI.   Physical Exam  BP 115/75 (BP Location: Right Arm)   Pulse 68   Temp 98.6 F (37 C) (Oral)   Resp 16   Wt 86.7 kg   SpO2 99%   BMI 29.07 kg/m   Constitutional: well developed, well nourished, no acute distress Eyes:  EOMI, conjunctiva normal bilaterally HENT: Normocephalic, atraumatic,mucus membranes moist Respiratory: Normal inspiratory effort Cardiovascular: Normal rate GI: nondistended, soft.  No suprapubic, flank tenderness Back: No CVAT skin: No rash, skin intact Musculoskeletal: no deformities Neurologic: Alert & oriented x 3, no focal neuro deficits Psychiatric: Speech and behavior appropriate   ED Course   Medications - No data  to display  Orders Placed This Encounter  Procedures   Urine Culture    Standing Status:   Standing    Number of Occurrences:   1    Indication:   Dysuria   POC Urinalysis Dipstick    Standing Status:   Standing    Number of Occurrences:   1    Results for orders placed or performed during the hospital encounter of 06/03/24 (from the past 24 hours)  POC Urinalysis Dipstick     Status: Abnormal   Collection Time: 06/03/24  8:43 AM  Result Value Ref Range   Color, UA yellow yellow   Clarity, UA clear clear   Glucose, UA negative negative mg/dL   Bilirubin, UA negative negative   Ketones, POC UA negative negative mg/dL   Spec Grav, UA 8.974 8.989 -  1.025   Blood, UA moderate (A) negative   pH, UA 5.5 5.0 - 8.0   Protein Ur, POC negative negative mg/dL   Urobilinogen, UA 1.0 0.2 or 1.0 E.U./dL   Nitrite, UA Positive (A) Negative   Leukocytes, UA Trace (A) Negative   No results found.  ED Clinical Impression  1. Urinary tract infection with hematuria, site unspecified      ED Assessment/Plan     Previous labs reviewed.  Additional medical history obtained.  As noted in HPI  Discussed with patient that her symptoms could also be coming from BV, so we will check a swab for BV and yeast.  She is not concerned for STDs, so deferring gonorrhea, chlamydia, trichomonas testing.  UA consistent with UTI.  Blood may be coming from her menses.  Will send off for culture to confirm antibiotic choice and diagnosis.  Discussed labs,  MDM, treatment plan, and plan for follow-up with patient. Discussed sn/sx that should prompt return to the ED. patient agrees with plan.   Meds ordered this encounter  Medications   nitrofurantoin, macrocrystal-monohydrate, (MACROBID) 100 MG capsule    Sig: Take 1 capsule (100 mg total) by mouth 2 (two) times daily for 5 days.    Dispense:  10 capsule    Refill:  0      *This clinic note was created using Scientist, clinical (histocompatibility and immunogenetics). Therefore, there may be occasional mistakes despite careful proofreading.  ?    Van Knee, MD 06/03/24 [phone]

## 2024-06-03 NOTE — ED Triage Notes (Signed)
 Pt c/o foul odor in urine x1 mon. Hx of kidney infection 1 mon ago. Had similar issue back in July & was treated at [facility] long ED.

## 2024-06-03 NOTE — Discharge Instructions (Signed)
 I have sent your urine off for culture to make sure that we have you on the correct antibiotic.  We will contact you if we need to change your antibiotics.  We are also checking for BV and yeast today.  And we will contact you if your BV or yeast come back positive and we will call in the appropriate medication to wherever you are.  Here is a list of primary care providers who are taking new patients:  [facility] [place] Dr. Selinda Ku (sports medicine) Dr. Harlene Duos Rolan Hoyle, PA Dr. Mackey Ny Dr. Harlene Saddler [address] [place] [address] [phone]  [facility] at [facility] [address] [place], [place] [address] [phone]  [facility] [place] [address] [place] [address]  [phone]  [facility] [address] [place], [place] [address] [phone]  [facility] [address] [place]  [phone] [place], [place] [address]  Here are clinics/ other resources who will see you if you do not have insurance. Some have certain criteria that you must meet. Call them and find out what they are:  [facility]: [address], [place], [place] [address] Phone: [phone] Hours: First and Third Saturdays of each Month, 9 a.m. - 1 p.m.  [facility]: [address], [address] [place], [place] [address] Phone: [phone] Hours: Tuesday, 4 p.m. - 8 p.m. Thursday, 1 p.m. - 8 p.m. Wednesday, 9 a.m. - [facility] [address] [place], [place] [address] Phone: [phone] Pharmacy Phone Number: [phone] Dental Phone Number: [phone] [facility] Insurance Help: [phone]  Dental Hours: Monday - Thursday, 8 a.m. - 6 p.m.  [facility] [address], [place], [place] [address] Phone: [phone] Pharmacy Phone Number: [phone] [facility] Insurance Help: [phone]  [facility] [address] [place] [place], [place]  [address] Phone: [phone] Pharmacy Phone Number: [phone] [facility] Insurance Help: [phone]  [facility] [address] [place], [place] [address] Phone: [phone] [facility] Insurance Help: [phone]   [facility] [address], [place], [place] [address] Phone: [phone]  Go to www.goodrx.com  or www.costplusdrugs.com to look up your medications. This will give you a list of where you can find your prescriptions at the most affordable prices. Or ask the pharmacist what the cash price is, or if they have any other discount programs available to help make your medication more affordable. This can be less expensive than what you would pay with insurance.

## 2024-06-04 LAB — CERVICOVAGINAL ANCILLARY ONLY
Bacterial Vaginitis (gardnerella): POSITIVE — AB
Candida Glabrata: NEGATIVE
Candida Vaginitis: NEGATIVE
Comment: NEGATIVE
Comment: NEGATIVE
Comment: NEGATIVE

## 2024-06-05 ENCOUNTER — Ambulatory Visit (HOSPITAL_COMMUNITY): Payer: Self-pay

## 2024-06-05 LAB — URINE CULTURE: Culture: >=100000 — AB

## 2024-06-05 MED ORDER — METRONIDAZOLE 500 MG PO TABS: 500.0000 mg | ORAL_TABLET | Freq: Two times a day (BID) | ORAL | 0 refills | Status: AC

## 2024-08-22 ENCOUNTER — Encounter: Payer: Self-pay | Admitting: Certified Nurse Midwife

## 2024-08-22 NOTE — Progress Notes (Unsigned)
" ° °  ANNUAL EXAM Patient name: Brittany Horn MRN [id number]  Date of birth: [birth year] Chief Complaint:   No chief complaint on file.  History of Present Illness:   Brittany Horn is a 29 y.o. [id number] {race:25618} female being seen today for a routine annual exam.  Current complaints: ***  No LMP recorded. (Menstrual status: IUD).       The pregnancy intention screening data noted above was reviewed. Potential methods of contraception were discussed. The patient elected to proceed with No data recorded.   No results found for: DIAGPAP, HPVHIGH, ADEQPAP    Last pap ***. Results were: {Pap findings:25134}. H/O abnormal pap: {yes/yes***/no:23866} Last mammogram: ***. Results were: {normal, abnormal, n/a:23837}. Family h/o breast cancer: {yes***/no:23838} Last colonoscopy: ***. Results were: {normal, abnormal, n/a:23837}. Family h/o colorectal cancer: {yes***/no:23838}      No data to display               No data to display            Past Medical History:  Diagnosis Date   Arthritis    PID (acute pelvic inflammatory disease)     Family History  Problem Relation Age of Onset   Other Mother 37       BARD1 gene mutation positive   Healthy Father    Uterine cancer Maternal Grandmother 74   Kidney cancer Paternal Grandmother    Lung cancer Paternal Grandmother    Breast cancer Maternal Aunt        77s   Ovarian cancer Maternal Aunt        24s   Brain cancer Paternal Aunt        35s   Breast cancer Paternal Aunt        14s   Breast cancer Paternal Aunt 86   Review of Systems:   Pertinent items are noted in HPI Denies any headaches, blurred vision, fatigue, shortness of breath, chest pain, abdominal pain, abnormal vaginal discharge/itching/odor/irritation, problems with periods, bowel movements, urination, or intercourse unless otherwise stated above. Pertinent History Reviewed:  Reviewed past medical,surgical, social and family history.   Reviewed problem list, medications and allergies. Physical Assessment:  There were no vitals filed for this visit.There is no height or weight on file to calculate BMI.       Physical Exam   No results found for this or any previous visit (from the past 24 hours).  Assessment & Plan:  There are no diagnoses linked to this encounter.  Mammogram: {Mammo f/u:25212::@ 29yo}, or sooner if problems Colonoscopy: {TCS f/u:25213::@ 29yo}, or sooner if problems  No orders of the defined types were placed in this encounter.   Meds: No orders of the defined types were placed in this encounter.   Follow-up: No follow-ups on file.  Rollo JINNY Maxin, CMA 08/22/2024 1:49 PM  "

## 2024-08-22 NOTE — Patient Instructions (Incomplete)
 Preventive Care 31-31 Years Old, Female Preventive care refers to lifestyle choices and visits with your health care provider that can promote health and wellness. Preventive care visits are also called wellness exams. What can I expect for my preventive care visit? Counseling During your preventive care visit, your health care provider may ask about your: Medical history, including: Past medical problems. Family medical history. Pregnancy history. Current health, including: Menstrual cycle. Method of birth control. Emotional well-being. Home life and relationship well-being. Sexual activity and sexual health. Lifestyle, including: Alcohol, nicotine or tobacco, and drug use. Access to firearms. Diet, exercise, and sleep habits. Work and work astronomer. Sunscreen use. Safety issues such as seatbelt and bike helmet use. Physical exam Your health care provider may check your: Height and weight. These may be used to calculate your BMI (body mass index). BMI is a measurement that tells if you are at a healthy weight. Waist circumference. This measures the distance around your waistline. This measurement also tells if you are at a healthy weight and may help predict your risk of certain diseases, such as type 2 diabetes and high blood pressure. Heart rate and blood pressure. Body temperature. Skin for abnormal spots. What immunizations do I need?  Vaccines are usually given at various ages, according to a schedule. Your health care provider will recommend vaccines for you based on your age, medical history, and lifestyle or other factors, such as travel or where you work. What tests do I need? Screening Your health care provider may recommend screening tests for certain conditions. This may include: Pelvic exam and Pap test. Lipid and cholesterol levels. Diabetes screening. This is done by checking your blood sugar (glucose) after you have not eaten for a while (fasting). Hepatitis  B test. Hepatitis C test. HIV (human immunodeficiency virus) test. STI (sexually transmitted infection) testing, if you are at risk. BRCA-related cancer screening. This may be done if you have a family history of breast, ovarian, tubal, or peritoneal cancers. Talk with your health care provider about your test results, treatment options, and if necessary, the need for more tests. Follow these instructions at home: Eating and drinking  Eat a healthy diet that includes fresh fruits and vegetables, whole grains, lean protein, and low-fat dairy products. Take vitamin and mineral supplements as recommended by your health care provider. Do not drink alcohol if: Your health care provider tells you not to drink. You are pregnant, may be pregnant, or are planning to become pregnant. If you drink alcohol: Limit how much you have to 0-1 drink a day. Know how much alcohol is in your drink. In the [place] one drink equals one 12 oz bottle of beer (355 mL), one 5 oz glass of wine (148 mL), or one 1 oz glass of hard liquor (44 mL). Lifestyle Brush your teeth every morning and night with fluoride toothpaste. Floss one time each day. Exercise for at least 30 minutes 5 or more days each week. Do not use any products that contain nicotine or tobacco. These products include cigarettes, chewing tobacco, and vaping devices, such as e-cigarettes. If you need help quitting, ask your health care provider. Do not use drugs. If you are sexually active, practice safe sex. Use a condom or other form of protection to prevent STIs. If you do not wish to become pregnant, use a form of birth control. If you plan to become pregnant, see your health care provider for a prepregnancy visit. Find healthy ways to manage stress, such as: Meditation,  yoga, or listening to music. Journaling. Talking to a trusted person. Spending time with friends and family. Minimize exposure to UV radiation to reduce your risk of skin  cancer. Safety Always wear your seat belt while driving or riding in a vehicle. Do not drive: If you have been drinking alcohol. Do not ride with someone who has been drinking. If you have been using any mind-altering substances or drugs. While texting. When you are tired or distracted. Wear a helmet and other protective equipment during sports activities. If you have firearms in your house, make sure you follow all gun safety procedures. Seek help if you have been physically or sexually abused. What's next? Go to your health care provider once a year for an annual wellness visit. Ask your health care provider how often you should have your eyes and teeth checked. Stay up to date on all vaccines. This information is not intended to replace advice given to you by your health care provider. Make sure you discuss any questions you have with your health care provider. Document Revised: 12/30/2020 Document Reviewed: 12/30/2020 Elsevier Patient Education  2024 [employer]How to Do a Breast Self-Exam Doing breast self-exams can help you stay healthy. They're one way to know what's normal for your breasts. They can help you catch a problem while it's still small and can be treated. You need to: Check your breasts often. Tell your doctor about any changes. You should do breast self-exams even if you have breast implants. What you need: A mirror. A well-lit room. A pillow or other soft object. How to do a breast self-exam Look for changes  Take off all the clothes above your waist. Stand in front of a mirror in a room with good lighting. Put your hands down at your sides. Compare your breasts in the mirror. Look for difference between them, such as: Differences in shape. Differences in size. Wrinkles, dips, and bumps in one breast and not the other. Look at each breast for skin changes, such as: Redness. Scaly spots. Spots where your skin is thicker. Dimpling. Open sores. Look for  changes in your nipples, such as: Fluid coming out of a nipple. Fluid around a nipple. Bleeding. Dimpling. Redness. A nipple that looks pushed in or that has changed position. Feel for changes Lie on your back. Feel each breast. To do this: Pick a breast to feel. Place a pillow under the shoulder closest to that breast. Put the arm closest to that breast behind your head. Feel the breast using the hand of your other arm. Use the pads of your three middle fingers to make small circles starting near the nipple. Use light, medium, and firm pressure. Keep making circles, moving down over the breast. Stop when you feel your ribs. Start making circles with your fingers again, this time going up until you reach your collarbone. Then, make circles out across your breast and into your armpit area. Squeeze your nipple. Check for fluid and lumps. Do these steps again to check your other breast. Sit or stand in the tub or shower. With soapy water on your skin, feel each breast the same way you did when you were lying down. Write down what you find Writing down what you find can help you keep track of what you want to tell your doctor. Write down: What's normal for each breast. Any changes you find. Write down: The kind of change. If your breast feels tender or painful. Any lump you find. Write down  its size and where it is. When you last had your period. General tips If you're breastfeeding, the best time to check your breasts is after you feed your baby or after you use a breast pump. If you get a period, the best time to check your breasts is 5-7 days after your period ends. With time, you'll get more used to doing the self-exam. You'll also start to know if there are changes in your breasts. Contact a doctor if: You see a change in the shape or size of your breasts or nipples. You see a change in the skin of your breast or nipples. You have fluid coming from your nipples that isn't  normal. You find a new lump or thick area. You have breast pain. You have any concerns about your breast health. This information is not intended to replace advice given to you by your health care provider. Make sure you discuss any questions you have with your health care provider. Document Revised: 09/13/2023 Document Reviewed: 09/13/2023 Elsevier Patient Education  2025 [employer]. Health Maintenance, Female Adopting a healthy lifestyle and getting preventive care are important in promoting health and wellness. Ask your health care provider about: The right schedule for you to have regular tests and exams. Things you can do on your own to prevent diseases and keep yourself healthy. What should I know about diet, weight, and exercise? Eat a healthy diet  Eat a diet that includes plenty of vegetables, fruits, low-fat dairy products, and lean protein. Do not eat a lot of foods that are high in solid fats, added sugars, or sodium. Maintain a healthy weight Body mass index (BMI) is used to identify weight problems. It estimates body fat based on height and weight. Your health care provider can help determine your BMI and help you achieve or maintain a healthy weight. Get regular exercise Get regular exercise. This is one of the most important things you can do for your health. Most adults should: Exercise for at least 150 minutes each week. The exercise should increase your heart rate and make you sweat (moderate-intensity exercise). Do strengthening exercises at least twice a week. This is in addition to the moderate-intensity exercise. Spend less time sitting. Even light physical activity can be beneficial. Watch cholesterol and blood lipids Have your blood tested for lipids and cholesterol at 31 years of age, then have this test every 5 years. Have your cholesterol levels checked more often if: Your lipid or cholesterol levels are high. You are older than 31 years of age. You are at  high risk for heart disease. What should I know about cancer screening? Depending on your health history and family history, you may need to have cancer screening at various ages. This may include screening for: Breast cancer. Cervical cancer. Colorectal cancer. Skin cancer. Lung cancer. What should I know about heart disease, diabetes, and high blood pressure? Blood pressure and heart disease High blood pressure causes heart disease and increases the risk of stroke. This is more likely to develop in people who have high blood pressure readings or are overweight. Have your blood pressure checked: Every 3-5 years if you are 42-51 years of age. Every year if you are 41 years old or older. Diabetes Have regular diabetes screenings. This checks your fasting blood sugar level. Have the screening done: Once every three years after age 63 if you are at a normal weight and have a low risk for diabetes. More often and at a younger age  if you are overweight or have a high risk for diabetes. What should I know about preventing infection? Hepatitis B If you have a higher risk for hepatitis B, you should be screened for this virus. Talk with your health care provider to find out if you are at risk for hepatitis B infection. Hepatitis C Testing is recommended for: Everyone born from [birth year] through 1967. Anyone with known risk factors for hepatitis C. Sexually transmitted infections (STIs) Get screened for STIs, including gonorrhea and chlamydia, if: You are sexually active and are younger than 31 years of age. You are older than 31 years of age and your health care provider tells you that you are at risk for this type of infection. Your sexual activity has changed since you were last screened, and you are at increased risk for chlamydia or gonorrhea. Ask your health care provider if you are at risk. Ask your health care provider about whether you are at high risk for HIV. Your health care provider may  recommend a prescription medicine to help prevent HIV infection. If you choose to take medicine to prevent HIV, you should first get tested for HIV. You should then be tested every 3 months for as long as you are taking the medicine. Pregnancy If you are about to stop having your period (premenopausal) and you may become pregnant, seek counseling before you get pregnant. Take 400 to 800 micrograms (mcg) of folic acid every day if you become pregnant. Ask for birth control (contraception) if you want to prevent pregnancy. Osteoporosis and menopause Osteoporosis is a disease in which the bones lose minerals and strength with aging. This can result in bone fractures. If you are 62 years old or older, or if you are at risk for osteoporosis and fractures, ask your health care provider if you should: Be screened for bone loss. Take a calcium or vitamin D supplement to lower your risk of fractures. Be given hormone replacement therapy (HRT) to treat symptoms of menopause. Follow these instructions at home: Alcohol use Do not drink alcohol if: Your health care provider tells you not to drink. You are pregnant, may be pregnant, or are planning to become pregnant. If you drink alcohol: Limit how much you have to: 0-1 drink a day. Know how much alcohol is in your drink. In the [place] one drink equals one 12 oz bottle of beer (355 mL), one 5 oz glass of wine (148 mL), or one 1 oz glass of hard liquor (44 mL). Lifestyle Do not use any products that contain nicotine or tobacco. These products include cigarettes, chewing tobacco, and vaping devices, such as e-cigarettes. If you need help quitting, ask your health care provider. Do not use street drugs. Do not share needles. Ask your health care provider for help if you need support or information about quitting drugs. General instructions Schedule regular health, dental, and eye exams. Stay current with your vaccines. Tell your health care provider  if: You often feel depressed. You have ever been abused or do not feel safe at home. This information is not intended to replace advice given to you by your health care provider. Make sure you discuss any questions you have with your health care provider. Document Revised: 01/10/2024 Document Reviewed: 11/23/2020 Elsevier Patient Education  2025 [employer].

## 2024-08-23 ENCOUNTER — Encounter: Payer: Self-pay | Admitting: Certified Nurse Midwife

## 2024-08-23 ENCOUNTER — Ambulatory Visit: Admitting: Certified Nurse Midwife

## 2024-08-23 VITALS — BP 106/71 | HR 80 | Ht 67.0 in | Wt 183.1 lb

## 2024-08-23 DIAGNOSIS — Z7689 Persons encountering health services in other specified circumstances: Secondary | ICD-10-CM

## 2024-08-23 DIAGNOSIS — Z131 Encounter for screening for diabetes mellitus: Secondary | ICD-10-CM

## 2024-08-23 DIAGNOSIS — Z01419 Encounter for gynecological examination (general) (routine) without abnormal findings: Secondary | ICD-10-CM

## 2024-08-23 DIAGNOSIS — Z124 Encounter for screening for malignant neoplasm of cervix: Secondary | ICD-10-CM

## 2024-08-23 DIAGNOSIS — Z1329 Encounter for screening for other suspected endocrine disorder: Secondary | ICD-10-CM

## 2024-08-23 DIAGNOSIS — N926 Irregular menstruation, unspecified: Secondary | ICD-10-CM

## 2024-08-23 DIAGNOSIS — Z1151 Encounter for screening for human papillomavirus (HPV): Secondary | ICD-10-CM
